# Patient Record
Sex: Female | Born: 1955
Health system: Southern US, Community
[De-identification: ages and names within clinical notes are randomized; demographics above are authoritative.]

## PROBLEM LIST (undated history)

## (undated) DIAGNOSIS — N6099 Unspecified benign mammary dysplasia of unspecified breast: Secondary | ICD-10-CM

## (undated) DIAGNOSIS — K219 Gastro-esophageal reflux disease without esophagitis: Secondary | ICD-10-CM

## (undated) DIAGNOSIS — I10 Essential (primary) hypertension: Secondary | ICD-10-CM

## (undated) DIAGNOSIS — K649 Unspecified hemorrhoids: Secondary | ICD-10-CM

## (undated) DIAGNOSIS — Z86718 Personal history of other venous thrombosis and embolism: Secondary | ICD-10-CM

## (undated) HISTORY — DX: Unspecified hemorrhoids: K64.9

## (undated) HISTORY — PX: BREAST BIOPSY: SHX20

## (undated) HISTORY — PX: BIOPSY THYROID: PRO38

## (undated) HISTORY — DX: Unspecified benign mammary dysplasia of unspecified breast: N60.99

## (undated) HISTORY — DX: Essential (primary) hypertension: I10

## (undated) HISTORY — DX: Personal history of other venous thrombosis and embolism: Z86.718

## (undated) HISTORY — DX: Gastro-esophageal reflux disease without esophagitis: K21.9

---

## 1995-06-10 HISTORY — PX: ABDOMINAL HYSTERECTOMY: SHX81

## 1999-02-05 ENCOUNTER — Encounter: Admission: RE | Admit: 1999-02-05 | Discharge: 1999-02-05 | Payer: Self-pay | Admitting: Sports Medicine

## 1999-02-27 ENCOUNTER — Encounter: Admission: RE | Admit: 1999-02-27 | Discharge: 1999-05-28 | Payer: Self-pay | Admitting: *Deleted

## 1999-03-07 ENCOUNTER — Encounter: Admission: RE | Admit: 1999-03-07 | Discharge: 1999-03-07 | Payer: Self-pay | Admitting: Family Medicine

## 1999-05-15 ENCOUNTER — Encounter: Admission: RE | Admit: 1999-05-15 | Discharge: 1999-05-15 | Payer: Self-pay | Admitting: Family Medicine

## 1999-05-22 ENCOUNTER — Encounter: Admission: RE | Admit: 1999-05-22 | Discharge: 1999-05-22 | Payer: Self-pay | Admitting: Family Medicine

## 1999-11-06 ENCOUNTER — Encounter: Admission: RE | Admit: 1999-11-06 | Discharge: 2000-02-04 | Payer: Self-pay | Admitting: *Deleted

## 2000-12-31 ENCOUNTER — Encounter: Admission: RE | Admit: 2000-12-31 | Discharge: 2000-12-31 | Payer: Self-pay | Admitting: Family Medicine

## 2001-01-11 ENCOUNTER — Encounter (INDEPENDENT_AMBULATORY_CARE_PROVIDER_SITE_OTHER): Payer: Self-pay | Admitting: *Deleted

## 2001-01-11 ENCOUNTER — Encounter: Admission: RE | Admit: 2001-01-11 | Discharge: 2001-01-11 | Payer: Self-pay | Admitting: Family Medicine

## 2001-02-12 ENCOUNTER — Encounter: Payer: Self-pay | Admitting: Family Medicine

## 2001-02-12 ENCOUNTER — Encounter: Admission: RE | Admit: 2001-02-12 | Discharge: 2001-02-12 | Payer: Self-pay | Admitting: Family Medicine

## 2003-01-08 ENCOUNTER — Encounter (INDEPENDENT_AMBULATORY_CARE_PROVIDER_SITE_OTHER): Payer: Self-pay | Admitting: *Deleted

## 2003-01-08 LAB — CONVERTED CEMR LAB

## 2003-02-06 ENCOUNTER — Encounter: Admission: RE | Admit: 2003-02-06 | Discharge: 2003-02-06 | Payer: Self-pay | Admitting: Family Medicine

## 2003-02-27 ENCOUNTER — Encounter: Admission: RE | Admit: 2003-02-27 | Discharge: 2003-02-27 | Payer: Self-pay | Admitting: Family Medicine

## 2003-04-14 ENCOUNTER — Encounter: Admission: RE | Admit: 2003-04-14 | Discharge: 2003-04-14 | Payer: Self-pay | Admitting: Family Medicine

## 2003-10-24 ENCOUNTER — Encounter: Admission: RE | Admit: 2003-10-24 | Discharge: 2003-10-24 | Payer: Self-pay | Admitting: Family Medicine

## 2003-11-27 ENCOUNTER — Encounter: Admission: RE | Admit: 2003-11-27 | Discharge: 2003-11-27 | Payer: Self-pay | Admitting: Family Medicine

## 2003-12-13 ENCOUNTER — Encounter: Admission: RE | Admit: 2003-12-13 | Discharge: 2003-12-13 | Payer: Self-pay | Admitting: Family Medicine

## 2004-09-11 ENCOUNTER — Ambulatory Visit: Payer: Self-pay | Admitting: Family Medicine

## 2005-01-14 ENCOUNTER — Encounter: Admission: RE | Admit: 2005-01-14 | Discharge: 2005-01-14 | Payer: Self-pay | Admitting: Sports Medicine

## 2005-01-16 ENCOUNTER — Ambulatory Visit: Payer: Self-pay | Admitting: Family Medicine

## 2005-12-26 ENCOUNTER — Ambulatory Visit: Payer: Self-pay | Admitting: Family Medicine

## 2006-03-03 ENCOUNTER — Encounter: Admission: RE | Admit: 2006-03-03 | Discharge: 2006-03-03 | Payer: Self-pay | Admitting: Family Medicine

## 2006-08-06 DIAGNOSIS — I10 Essential (primary) hypertension: Secondary | ICD-10-CM | POA: Insufficient documentation

## 2006-08-06 DIAGNOSIS — K219 Gastro-esophageal reflux disease without esophagitis: Secondary | ICD-10-CM | POA: Insufficient documentation

## 2006-08-06 DIAGNOSIS — M461 Sacroiliitis, not elsewhere classified: Secondary | ICD-10-CM | POA: Insufficient documentation

## 2006-08-06 DIAGNOSIS — N951 Menopausal and female climacteric states: Secondary | ICD-10-CM | POA: Insufficient documentation

## 2006-08-07 ENCOUNTER — Encounter (INDEPENDENT_AMBULATORY_CARE_PROVIDER_SITE_OTHER): Payer: Self-pay | Admitting: *Deleted

## 2007-03-05 ENCOUNTER — Telehealth: Payer: Self-pay | Admitting: *Deleted

## 2007-03-09 ENCOUNTER — Telehealth (INDEPENDENT_AMBULATORY_CARE_PROVIDER_SITE_OTHER): Payer: Self-pay | Admitting: Family Medicine

## 2007-03-16 ENCOUNTER — Ambulatory Visit: Payer: Self-pay | Admitting: Family Medicine

## 2007-03-16 ENCOUNTER — Encounter (INDEPENDENT_AMBULATORY_CARE_PROVIDER_SITE_OTHER): Payer: Self-pay | Admitting: Family Medicine

## 2007-03-16 DIAGNOSIS — E785 Hyperlipidemia, unspecified: Secondary | ICD-10-CM | POA: Insufficient documentation

## 2007-03-17 ENCOUNTER — Encounter (INDEPENDENT_AMBULATORY_CARE_PROVIDER_SITE_OTHER): Payer: Self-pay | Admitting: Family Medicine

## 2007-03-22 LAB — CONVERTED CEMR LAB
ALT: 17 units/L (ref 0–35)
AST: 19 units/L (ref 0–37)
Albumin: 4.3 g/dL (ref 3.5–5.2)
Alkaline Phosphatase: 38 units/L — ABNORMAL LOW (ref 39–117)
BUN: 13 mg/dL (ref 6–23)
CO2: 24 meq/L (ref 19–32)
Calcium: 10 mg/dL (ref 8.4–10.5)
Chloride: 103 meq/L (ref 96–112)
Cholesterol: 207 mg/dL — ABNORMAL HIGH (ref 0–200)
Creatinine, Ser: 0.78 mg/dL (ref 0.40–1.20)
Glucose, Bld: 87 mg/dL (ref 70–99)
HDL: 50 mg/dL (ref >39)
LDL Cholesterol: 142 mg/dL — ABNORMAL HIGH (ref 0–99)
Potassium: 4.2 meq/L (ref 3.5–5.3)
Sodium: 140 meq/L (ref 135–145)
TSH: 1.574 microintl units/mL (ref 0.350–5.50)
Total Bilirubin: 0.4 mg/dL (ref 0.3–1.2)
Total CHOL/HDL Ratio: 4.1
Total Protein: 6.9 g/dL (ref 6.0–8.3)
Triglycerides: 74 mg/dL (ref <150)
VLDL: 15 mg/dL (ref 0–40)

## 2007-04-14 ENCOUNTER — Ambulatory Visit: Payer: Self-pay | Admitting: Family Medicine

## 2007-04-14 ENCOUNTER — Encounter (INDEPENDENT_AMBULATORY_CARE_PROVIDER_SITE_OTHER): Payer: Self-pay | Admitting: Family Medicine

## 2007-04-14 DIAGNOSIS — J309 Allergic rhinitis, unspecified: Secondary | ICD-10-CM | POA: Insufficient documentation

## 2007-04-14 LAB — CONVERTED CEMR LAB
BUN: 13 mg/dL (ref 6–23)
CO2: 29 meq/L (ref 19–32)
Calcium: 10.3 mg/dL (ref 8.4–10.5)
Chloride: 102 meq/L (ref 96–112)
Creatinine, Ser: 0.76 mg/dL (ref 0.40–1.20)
Glucose, Bld: 94 mg/dL (ref 70–99)
Potassium: 3.9 meq/L (ref 3.5–5.3)
Sodium: 140 meq/L (ref 135–145)

## 2007-04-15 ENCOUNTER — Encounter (INDEPENDENT_AMBULATORY_CARE_PROVIDER_SITE_OTHER): Payer: Self-pay | Admitting: Family Medicine

## 2007-04-15 ENCOUNTER — Encounter: Admission: RE | Admit: 2007-04-15 | Discharge: 2007-04-15 | Payer: Self-pay | Admitting: Sports Medicine

## 2008-03-13 ENCOUNTER — Encounter (INDEPENDENT_AMBULATORY_CARE_PROVIDER_SITE_OTHER): Payer: Self-pay | Admitting: Family Medicine

## 2008-08-28 ENCOUNTER — Ambulatory Visit: Payer: Self-pay | Admitting: Family Medicine

## 2010-07-02 ENCOUNTER — Ambulatory Visit: Admission: RE | Admit: 2010-07-02 | Discharge: 2010-07-02 | Payer: Self-pay | Source: Home / Self Care

## 2010-07-02 ENCOUNTER — Encounter: Payer: Self-pay | Admitting: Family Medicine

## 2010-07-02 LAB — CONVERTED CEMR LAB
Albumin: 4.1 g/dL (ref 3.5–5.2)
Alkaline Phosphatase: 44 units/L (ref 39–117)
CO2: 29 meq/L (ref 19–32)
Calcium: 9.5 mg/dL (ref 8.4–10.5)
Chloride: 106 meq/L (ref 96–112)
Cholesterol: 210 mg/dL — ABNORMAL HIGH (ref 0–200)
Glucose, Bld: 95 mg/dL (ref 70–99)
LDL Cholesterol: 147 mg/dL — ABNORMAL HIGH (ref 0–99)
Potassium: 3.7 meq/L (ref 3.5–5.3)
Sodium: 141 meq/L (ref 135–145)
Total Protein: 6.6 g/dL (ref 6.0–8.3)
Triglycerides: 66 mg/dL (ref <150)

## 2010-07-09 ENCOUNTER — Encounter: Payer: Self-pay | Admitting: Family Medicine

## 2010-07-11 NOTE — Assessment & Plan Note (Signed)
Summary: follow-up/eo   TDAP GIVEN TODAY.Jimmy Footman, CMA  July 02, 2010 12:11 PM  Vital Signs:  Patient profile:   55 year old female Height:      65 inches Weight:      204.4 pounds BMI:     34.14 Temp:     98.5 degrees F oral Pulse rate:   84 / minute BP sitting:   174 / 91  (left arm) Cuff size:   large  Vitals Entered By: Jimmy Footman, CMA (July 02, 2010 11:02 AM) CC: follow-up Is Patient Diabetic? No Pain Assessment Patient in pain? no        Primary Care Provider:  Delbert Harness MD  CC:  follow-up.  History of Present Illness: 55 yo with hypertension here for follow-up.  Has not been seen since March 2010  hypertension: has not been checking BP or taking any medication in over a year.  "feels" blood pressure is high.  Denies cp, dyspnea, visual changes.    hyperlipidemia:  exercises by walking some daily.  No diet  Grief:  husband died two years ago, still feels sad as anniversary is coming up.  Feels she has good support, able to enjoy life and hobbies.    Habits & Providers  Alcohol-Tobacco-Diet     Tobacco Status: never  Current Medications (verified): 1)  Lisinopril 10 Mg Tabs (Lisinopril) .... Take One Tablet Daily  Allergies: 1)  ! Codeine  Past History:  Past Medical History: Last updated: 08/06/2006 h/o dvt with pe on ocp`s  Past Surgical History: Last updated: 08/06/2006 Hysterectomy - Partial fibroids -, skin tag removal - 02/27/2003  Family History: Last updated: 07/02/2010 Bro x 2-Prstate Ca, F-lung Ca (deceased at 65), M-DM, stroke, osteoporosis (deceased at 63), S-CAD, S-DM, S-throat Ca.  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  No breast ca- neice  Review of Systems General:  Denies chills, fever, and weight loss. Eyes:  Denies blurring. CV:  Complains of swelling of feet; denies chest pain or discomfort, fatigue, near fainting, and palpitations. Resp:  Denies cough and shortness of breath. GI:  Denies abdominal pain, bloody stools, constipation, nausea, and vomiting. GU:  Denies discharge and dysuria. MS:  Denies joint pain. Psych:  Denies anxiety and depression.  Physical Exam  General:  Well-developed,well-nourished,in no acute distress; alert,appropriate and cooperative throughout examination Eyes:  non icteric Mouth:  MMM, clear oropharynx.  upper dentures in place Neck:  supple, full ROM, and no masses.   Lungs:  Normal respiratory effort, chest expands symmetrically. Lungs are clear to auscultation, no crackles or wheezes. Heart:  Normal rate and regular rhythm. S1 and S2 normal without gallop, murmur, click, rub or other extra sounds. Abdomen:  Bowel sounds positive,abdomen soft and non-tender without masses, organomegaly or hernias noted. Extremities:  no LE edema Psych:  Oriented X3, memory intact for recent and remote, normally interactive, good eye contact, not anxious appearing, and not depressed appearing.     Impression & Recommendations:  Problem # 1:  HYPERTENSION, BENIGN SYSTEMIC (ICD-401.1) Will restart lisinopril.  patient states did not tolerate HCTZ due to having urinate too frequently.  Will follow-up in 2-3  weeks for recheck and will likely need increase and opssibly addition of another agent.  Would add amlodipine next.  The following medications were removed from the medication list:    Lopressor 50 Mg Tabs (Metoprolol tartrate) ..... One by mouth two times a day    Lisinopril-hydrochlorothiazide 10-12.5 Mg Tabs (Lisinopril-hydrochlorothiazide) ..... One tablet daily.  needs office visit. Her updated medication  list for this problem includes:    Lisinopril 10 Mg Tabs (Lisinopril) .Marland Kitchen... Take one tablet daily  Orders: Lipid-[facility] (81191-47829) Comp Met-[facility] (56213-08657) [facility]- Est  Level 4 (84696)  Problem # 2:  HYPERLIPIDEMIA NEC/NOS (ICD-272.4)  Will chekc flp tdoay  Orders: [facility]- Est  Level 4 (29528)  Problem # 3:  Preventive Health Care (ICD-V70.0)  will give tdap today and give info for mammogram.  She declines colonoscopy but will do stool cards.  Will return for annual gynecologcal exam  Orders: [facility]- Est  Level 4 (41324)  Complete Medication List: 1)  Lisinopril 10 Mg Tabs (Lisinopril) .... Take one tablet daily  Other Orders: TSH-[facility] (40102-72536) Tdap => 89yrs IM (64403) Admin 1st Vaccine (47425)  Patient Instructions: 1)  Check your blood pressure regularly and bring log to next visit 2)  Schedule annual gynecologcal visit in 2-3 weeks and we will recheck your labwork at that time and discuss results of tody's labwork (does not need to be fasting this time) 3)  Will restart lisinopril. 4)  Bring back stool cards Prescriptions: LISINOPRIL 10 MG TABS (LISINOPRIL) take one tablet daily  #30 x 1   Entered and Authorized by:   Delbert Harness MD   Signed by:   Delbert Harness MD on 07/02/2010   Method used:   Electronically to        [facility] [address] (retail)       [phone]       [place], [place]  [address]       Ph: [phone]       Fax: [phone]   RxID:   [id number]    Orders Added: 1)  Lipid-[facility] [80061-22930] 2)  Comp Met-[facility] [70623-76283] 3)  TSH-[facility] [15176-16073] 4)  Tdap => 76yrs IM [90715] 5)  Admin 1st Vaccine [90471] 6)  [facility]- Est  Level 4 [71062]   Tetanus Vaccine (to be given today)   Tetanus Vaccine (to be given today)   Prevention & Chronic Care Immunizations   Influenza vaccine: Not documented   Influenza vaccine due: 02/08/2012    Tetanus booster: 07/02/2010: Tdap    Pneumococcal vaccine: Not  documented  Colorectal Screening   Hemoccult: Not documented   Hemoccult due: Not Indicated    Colonoscopy: Not documented  Other Screening   Pap smear: Done.  (01/08/2003)   Pap smear due: Not Indicated    Mammogram: normal  (04/15/2007)   Mammogram due: 04/2008   Smoking status: never  (07/02/2010)  Lipids   Total Cholesterol: 207  (03/16/2007)   LDL: 142  (03/16/2007)   LDL Direct: Not documented   HDL: 50  (03/16/2007)   Triglycerides: 74  (03/16/2007)    SGOT (AST): 19  (03/16/2007)   SGPT (ALT): 17  (03/16/2007) CMP ordered    Alkaline phosphatase: 38  (03/16/2007)   Total bilirubin: 0.4  (03/16/2007)  Hypertension   Last Blood Pressure: 174 / 91  (07/02/2010)   Serum creatinine: 0.76  (04/14/2007)   Serum potassium 3.9  (04/14/2007) CMP ordered   Self-Management Support :  Personal Goals (by the next clinic visit) :      Personal blood pressure goal: 130/80  (07/02/2010)     Personal LDL goal: 130  (07/02/2010)    Patient will work on the following items until the next clinic visit to reach self-care goals:     Medications and monitoring: take my medicines every day, check my blood pressure  (07/02/2010)     Eating: use fresh or frozen vegetables, eat foods that are low in salt, eat baked foods instead of fried foods, eat fruit for snacks and desserts  (07/02/2010)     Activity: take a 30 minute walk every day  (07/02/2010)    Hypertension self-management support: Written self-care plan  (07/02/2010)   Hypertension self-care plan printed.    Lipid self-management support: Not documented    Nursing Instructions: Give tetanus booster today

## 2010-07-17 NOTE — Letter (Signed)
Summary: Lipid Letter  [facility]  [address]   [place], [place] [address]   Phone: [phone]  Fax: [phone]    07/09/2010  Demeisha Geraghty [address] [place], [place]  [address]  Dear Pattricia Boss:  We have carefully reviewed your last lipid profile from 07/02/2010 and the results are noted below with a summary of recommendations for lipid management.    Cholesterol:       210     Goal: <200   HDL "good" Cholesterol:   50     Goal: >40   LDL "bad" Cholesterol:   147     Goal: <160   Triglycerides:       66     Goal: <150    I do not think you need medications for your cholesterol, but you would benefit from Therapeutic lifestyle changes that will also make a big difference in your high blood pressure.  Please see below and we can discuss more at your next visit.  Your other labs which included thyroid level were all normal.    TLC Diet (Therapeutic Lifestyle Change): Saturated Fats & Transfatty acids should be kept < 7% of total calories ***Reduce Saturated Fats Polyunstaurated Fat can be up to 10% of total calories Monounsaturated Fat Fat can be up to 20% of total calories Total Fat should be no greater than 25-35% of total calories Carbohydrates should be 50-60% of total calories Protein should be approximately 15% of total calories Fiber should be at least 20-30 grams a day ***Increased fiber may help lower LDL Total Cholesterol should be < 200mg /day ***A higher intake of unsaturated fat may reduce Triglycerides and Increase HDL    Adjunctive Measures (may lower LIPIDS and reduce risk of Heart Attack) include: Aerobic Exercise (20-30 minutes 3-4 times a week) Limit Alcohol Consumption Weight Reduction Dietary Fiber 20-30 grams a day by mouth    Current Medications: 1)    Lisinopril 10 Mg Tabs (Lisinopril) .... Take one tablet daily  If you have any questions, please call. We appreciate being able to work with you.   Sincerely,    [facility] Delbert Harness MD  Appended Document: Lipid Letter letter mailed

## 2010-07-24 ENCOUNTER — Encounter: Payer: Self-pay | Admitting: *Deleted

## 2010-07-24 ENCOUNTER — Other Ambulatory Visit: Payer: Self-pay | Admitting: Family Medicine

## 2010-07-24 DIAGNOSIS — Z1231 Encounter for screening mammogram for malignant neoplasm of breast: Secondary | ICD-10-CM

## 2010-07-25 ENCOUNTER — Ambulatory Visit (INDEPENDENT_AMBULATORY_CARE_PROVIDER_SITE_OTHER): Payer: PRIVATE HEALTH INSURANCE | Admitting: Family Medicine

## 2010-07-25 ENCOUNTER — Other Ambulatory Visit (HOSPITAL_COMMUNITY)
Admission: RE | Admit: 2010-07-25 | Discharge: 2010-07-25 | Disposition: A | Discharge status: Home / Self Care | Payer: PRIVATE HEALTH INSURANCE | Source: Ambulatory Visit | Attending: Family Medicine | Admitting: Family Medicine

## 2010-07-25 ENCOUNTER — Encounter: Payer: Self-pay | Admitting: Family Medicine

## 2010-07-25 VITALS — BP 165/98 | HR 80 | Temp 98.0°F | Ht 66.0 in | Wt 201.5 lb

## 2010-07-25 DIAGNOSIS — Z124 Encounter for screening for malignant neoplasm of cervix: Secondary | ICD-10-CM

## 2010-07-25 DIAGNOSIS — N951 Menopausal and female climacteric states: Secondary | ICD-10-CM

## 2010-07-25 DIAGNOSIS — I10 Essential (primary) hypertension: Secondary | ICD-10-CM

## 2010-07-25 DIAGNOSIS — Z01419 Encounter for gynecological examination (general) (routine) without abnormal findings: Secondary | ICD-10-CM | POA: Insufficient documentation

## 2010-07-25 LAB — BASIC METABOLIC PANEL
BUN: 11 mg/dL (ref 6–23)
Chloride: 105 mEq/L (ref 96–112)
Potassium: 3.9 mEq/L (ref 3.5–5.3)

## 2010-07-25 MED ORDER — METOPROLOL TARTRATE 25 MG PO TABS: 25.0000 mg | ORAL_TABLET | Freq: Two times a day (BID) | ORAL | Status: AC

## 2010-07-25 NOTE — Patient Instructions (Addendum)
Keep blood pressure log New medicine for blood pressure Follow-up in 1 month

## 2010-07-25 NOTE — Progress Notes (Signed)
Annual Gynecological Exam  Last period: postmenopausal  Regular periods: n/a Heavy bleeding: n/a  Sexually active: no Birth control or hormonal therapy: none, contraindicated d/p PE/DVT Hx of STD: no Dyspareunia:no hot flashes: yes, occurs at night but she is awake as she works the night shifts.  Several time lasting 10 minutes. Vaginal discharge: no Dysuria: no  Last mammogram: up to date in the last year   Breast mass or concerns: no Last Pap: remote, hysterectomy History of abnormal pap: No  FH of breast, uterine, ovarian, colon cancer: No  CHRONIC HYPERTENSION  Disease Monitoring  Blood pressure range: 130-140/ 80-90  Chest pain: no   Dyspnea: no   Claudication: no   Medication compliance: yes  Medication Side Effects  Lightheadedness: no   Urinary frequency: no   Edema: no   Impotence: no   Preventitive Healthcare:  Exercise: yes, walks 30 minutes 5x per week   Diet Pattern: none  Salt Restriction: no   ROS:  As per HPI  Physical Exam  Constitutional: She is oriented to person, place, and time and well-developed, well-nourished, and in no distress.  HENT:  Head: Normocephalic and atraumatic.  Neck: Normal range of motion. Neck supple. No thyromegaly present.  Cardiovascular: Normal rate, regular rhythm and normal heart sounds.   No murmur heard. Pulmonary/Chest: Effort normal and breath sounds normal. No respiratory distress. She has no wheezes. She has no rales.  Abdominal: Soft. She exhibits no mass. There is no tenderness.  Genitourinary: Vagina normal. No vaginal discharge found.       Patient with history of hysterectomy.  Visually appears to be cervix, possible os palpated on bimanual exam.  Pap obtained.  Neurological: She is alert and oriented to person, place, and time.  Skin: Skin is warm.  Psychiatric: Mood and affect normal.

## 2010-07-26 NOTE — Assessment & Plan Note (Signed)
Obtained pap today although by history had hysterectomy, appears to have cervix.

## 2010-07-26 NOTE — Assessment & Plan Note (Signed)
Fairly good control at home but very elevated today.  Would benefit from additional control especially in light that she was previously on 3 agents.  Will add metoprolol.  Avoid HCTZ due to urinary frequency.  Will recheck lisinopril today.  If BP continues to be elevated, would increase lisinopril.

## 2010-07-26 NOTE — Assessment & Plan Note (Signed)
Does not interfere with quality of life.  Not a candidate for hormonal tx.  Advised increasing physical activity.  If continues to bother her, would discuss non-hormonal therapies further.

## 2010-07-31 ENCOUNTER — Encounter: Payer: Self-pay | Admitting: Family Medicine

## 2010-08-08 DIAGNOSIS — N6099 Unspecified benign mammary dysplasia of unspecified breast: Secondary | ICD-10-CM

## 2010-08-08 HISTORY — DX: Unspecified benign mammary dysplasia of unspecified breast: N60.99

## 2010-08-09 ENCOUNTER — Ambulatory Visit
Admission: RE | Admit: 2010-08-09 | Discharge: 2010-08-09 | Disposition: A | Discharge status: Home / Self Care | Payer: PRIVATE HEALTH INSURANCE | Source: Ambulatory Visit | Attending: *Deleted | Admitting: *Deleted

## 2010-08-09 DIAGNOSIS — Z1231 Encounter for screening mammogram for malignant neoplasm of breast: Secondary | ICD-10-CM

## 2010-08-13 ENCOUNTER — Other Ambulatory Visit: Payer: Self-pay | Admitting: *Deleted

## 2010-08-13 DIAGNOSIS — R928 Other abnormal and inconclusive findings on diagnostic imaging of breast: Secondary | ICD-10-CM

## 2010-08-20 ENCOUNTER — Ambulatory Visit
Admission: RE | Admit: 2010-08-20 | Discharge: 2010-08-20 | Disposition: A | Discharge status: Home / Self Care | Payer: PRIVATE HEALTH INSURANCE | Source: Ambulatory Visit | Attending: *Deleted | Admitting: *Deleted

## 2010-08-20 ENCOUNTER — Other Ambulatory Visit: Payer: Self-pay | Admitting: *Deleted

## 2010-08-20 ENCOUNTER — Other Ambulatory Visit: Payer: Self-pay | Admitting: Diagnostic Radiology

## 2010-08-20 DIAGNOSIS — R928 Other abnormal and inconclusive findings on diagnostic imaging of breast: Secondary | ICD-10-CM

## 2010-08-26 ENCOUNTER — Other Ambulatory Visit: Payer: Self-pay | Admitting: General Surgery

## 2010-08-26 DIAGNOSIS — N6099 Unspecified benign mammary dysplasia of unspecified breast: Secondary | ICD-10-CM

## 2010-08-29 ENCOUNTER — Ambulatory Visit (INDEPENDENT_AMBULATORY_CARE_PROVIDER_SITE_OTHER): Payer: PRIVATE HEALTH INSURANCE | Admitting: Family Medicine

## 2010-08-29 ENCOUNTER — Ambulatory Visit: Payer: PRIVATE HEALTH INSURANCE | Admitting: Family Medicine

## 2010-08-29 ENCOUNTER — Encounter: Payer: Self-pay | Admitting: Family Medicine

## 2010-08-29 VITALS — BP 130/90 | HR 82 | Temp 98.7°F | Ht 64.6 in | Wt 197.0 lb

## 2010-08-29 DIAGNOSIS — N951 Menopausal and female climacteric states: Secondary | ICD-10-CM

## 2010-08-29 DIAGNOSIS — N6310 Unspecified lump in the right breast, unspecified quadrant: Secondary | ICD-10-CM

## 2010-08-29 DIAGNOSIS — N63 Unspecified lump in unspecified breast: Secondary | ICD-10-CM

## 2010-08-29 DIAGNOSIS — I10 Essential (primary) hypertension: Secondary | ICD-10-CM

## 2010-08-29 LAB — BASIC METABOLIC PANEL
BUN: 15 mg/dL (ref 6–23)
CO2: 27 mEq/L (ref 19–32)
Glucose, Bld: 95 mg/dL (ref 70–99)
Potassium: 4.3 mEq/L (ref 3.5–5.3)
Sodium: 142 mEq/L (ref 135–145)

## 2010-08-29 MED ORDER — VENLAFAXINE HCL ER 37.5 MG PO CP24: ORAL_CAPSULE | ORAL | Status: DC

## 2010-08-29 NOTE — Patient Instructions (Signed)
I would avoid black cohosh as it may not be safe in breast cancer Start venlafaxine, one tablet a day for 1 week, then two tablets a day.   Follow-up in 2-3 months or sooner if needed.

## 2010-08-29 NOTE — Progress Notes (Signed)
  Subjective:    Patient ID: Angelica Park, female    DOB: 1955/09/13, 55 y.o.   MRN: [id number]  HPI Menopause:  Patient states menopausal symptoms we discussed at last visit are worse and would like to discuss treatment options.  Continues to have hot flashes during night and some during day.  She works night shift so it has more social stigma and impact on her life as she has these symptoms while she is at work.  Denies weight loss, changes in bowels, palpitations.  Enquires about starting use of black cohosh.  Breast mass:  Since last visit, mammogram has been abnormal, patient has a biopsy which per her report is "pre-cancer" and is planning on having a lumpectomy next week.  She is nervous but feels she knows what to expect.  HYPERTENSION  BP Readings from Last 3 Encounters:  08/29/10 130/90  07/25/10 165/98  07/02/10 174/91    Hypertension ROS: taking medications as instructed, no medication side effects noted, no TIA's, no chest pain on exertion, no dyspnea on exertion, no swelling of ankles and no intermittent claudication symptoms.   No results found for this basename: PROTEIN24HR         Review of Systems See hpi    Objective:   Physical Exam  Constitutional: She appears well-developed and well-nourished.  HENT:  Head: Normocephalic and atraumatic.  Neck: No thyromegaly present.  Cardiovascular: Normal rate and regular rhythm.   No murmur heard. Pulmonary/Chest: Effort normal. No respiratory distress. She has no wheezes. She has no rales.  Abdominal: Soft. She exhibits no distension. There is no tenderness. There is no rebound and no guarding.  Musculoskeletal: She exhibits no edema.          Assessment & Plan:

## 2010-08-30 NOTE — Assessment & Plan Note (Signed)
Not a candidate for hormonal tx.  Advised against black cohosh given her ongoing  workup for breast mass.  Will start effexor, avoiding sedating effects of gabapentin as she works the night shift when most of her symptoms occur.

## 2010-08-30 NOTE — Assessment & Plan Note (Signed)
Started lisinopril at last visit.  Improved control.  Will recheck cr.

## 2010-08-30 NOTE — Assessment & Plan Note (Signed)
Patient undergoing lumpectomy.  Patient states "precancer"  Will obtain records once final path returns or at next visit.

## 2010-09-05 ENCOUNTER — Ambulatory Visit
Admission: RE | Admit: 2010-09-05 | Discharge: 2010-09-05 | Disposition: A | Discharge status: Home / Self Care | Payer: PRIVATE HEALTH INSURANCE | Source: Ambulatory Visit | Attending: General Surgery | Admitting: General Surgery

## 2010-09-05 ENCOUNTER — Other Ambulatory Visit: Payer: Self-pay | Admitting: General Surgery

## 2010-09-05 ENCOUNTER — Encounter (HOSPITAL_BASED_OUTPATIENT_CLINIC_OR_DEPARTMENT_OTHER)
Admission: RE | Admit: 2010-09-05 | Discharge: 2010-09-05 | Disposition: A | Discharge status: Home / Self Care | Payer: PRIVATE HEALTH INSURANCE | Source: Ambulatory Visit | Attending: General Surgery | Admitting: General Surgery

## 2010-09-05 DIAGNOSIS — Z01811 Encounter for preprocedural respiratory examination: Secondary | ICD-10-CM

## 2010-09-05 DIAGNOSIS — Z01812 Encounter for preprocedural laboratory examination: Secondary | ICD-10-CM | POA: Insufficient documentation

## 2010-09-05 DIAGNOSIS — Z01810 Encounter for preprocedural cardiovascular examination: Secondary | ICD-10-CM | POA: Insufficient documentation

## 2010-09-05 LAB — BASIC METABOLIC PANEL
BUN: 8 mg/dL (ref 6–23)
CO2: 29 mEq/L (ref 19–32)
Chloride: 104 mEq/L (ref 96–112)
Creatinine, Ser: 0.77 mg/dL (ref 0.4–1.2)

## 2010-09-06 ENCOUNTER — Ambulatory Visit
Admission: RE | Admit: 2010-09-06 | Discharge: 2010-09-06 | Disposition: A | Discharge status: Home / Self Care | Payer: PRIVATE HEALTH INSURANCE | Source: Ambulatory Visit | Attending: General Surgery | Admitting: General Surgery

## 2010-09-06 ENCOUNTER — Other Ambulatory Visit: Payer: Self-pay | Admitting: General Surgery

## 2010-09-06 ENCOUNTER — Ambulatory Visit (HOSPITAL_BASED_OUTPATIENT_CLINIC_OR_DEPARTMENT_OTHER)
Admission: RE | Admit: 2010-09-06 | Discharge: 2010-09-06 | Disposition: A | Discharge status: Home / Self Care | Payer: PRIVATE HEALTH INSURANCE | Source: Ambulatory Visit | Attending: General Surgery | Admitting: General Surgery

## 2010-09-06 DIAGNOSIS — N6099 Unspecified benign mammary dysplasia of unspecified breast: Secondary | ICD-10-CM

## 2010-09-06 DIAGNOSIS — E669 Obesity, unspecified: Secondary | ICD-10-CM | POA: Insufficient documentation

## 2010-09-06 DIAGNOSIS — K219 Gastro-esophageal reflux disease without esophagitis: Secondary | ICD-10-CM | POA: Insufficient documentation

## 2010-09-06 DIAGNOSIS — N6089 Other benign mammary dysplasias of unspecified breast: Secondary | ICD-10-CM | POA: Insufficient documentation

## 2010-09-06 DIAGNOSIS — I10 Essential (primary) hypertension: Secondary | ICD-10-CM | POA: Insufficient documentation

## 2010-09-06 DIAGNOSIS — Z01812 Encounter for preprocedural laboratory examination: Secondary | ICD-10-CM | POA: Insufficient documentation

## 2010-09-08 HISTORY — PX: BREAST SURGERY: SHX581

## 2010-09-14 NOTE — Op Note (Signed)
  NAMECALYSTA, CRAIGO                ACCOUNT NO.:  [id number]  MEDICAL RECORD NO.:  [id number]          PATIENT TYPE:  LOCATION:                                 FACILITY:  PHYSICIAN:  Juanetta Gosling, MDDATE OF BIRTH:  06-06-1956  DATE OF PROCEDURE:  09/06/2010 DATE OF DISCHARGE:                              OPERATIVE REPORT   PREOPERATIVE DIAGNOSIS:  Right breast atypical ductal hyperplasia x2.  POSTOPERATIVE DIAGNOSIS:  Right breast atypical ductal hyperplasia x2.  PROCEDURE:  Right breast wire localization biopsy x2.  SURGEON:  Juanetta Gosling, MD  ASSISTANT:  None.  ANESTHESIA:  General.  SPECIMENS:  Right breast tissue x2 to Pathology.  ESTIMATED BLOOD LOSS:  Minimal.  COMPLICATIONS:  None.  DRAINS:  None.  DISPOSITION:  To recovery room in stable condition.  INDICATIONS:  This is a 55 year old female who underwent a screening mammogram and was found to have a couple different areas of abnormal calcifications.  She underwent stereotactic biopsies that showed ADH. She was then referred for surgical excision of these areas.  We discussed wire localization biopsies.  PROCEDURE:  After informed consent was obtained, the patient was taken to the operating room.  She first had 2 wires placed.  I had the mammograms available for my review.  She was administered 1 g of intravenous cefazolin.  Sequential compression devices were placed on her lower extremities prior to induction of anesthesia.  She was then placed under general anesthesia without complication.  Her right breast was prepped and draped in standard sterile surgical fashion.  The anterior most lesion near the nipple was approached first.  This was specimen 1 anterior.  I made a periareolar incision, used cautery to excise the wires all the way, as well as the surrounding tissue. Faxitron mammogram confirmed removal of the lesion.  Hemostasis was observed.  I packed this with a sponge.  I then went  to the posterior lesion.  This was labeled #2.  I made a radial incision overlying this lesion and removed this with electrocautery.  Faxitron mammogram confirmed removal of the clip and the lesion as well.  This was confirmed by the radiologist also.  I then closed both of these incisions with 3-0 Vicryl, 4-0 Monocryl.  Steri-Strips and sterile dressing were placed.  10 mL of 0.25% Marcaine were infiltrated at each wound.  Sterile dressings were placed over this.  She tolerated this well and was extubated in the operating room and transferred to the recovery room in a stable condition.     Juanetta Gosling, MD     MCW/MEDQ  D:  09/06/2010  T:  09/07/2010  Job:  [id number]  Electronically Signed by Emelia Loron MD on 09/14/2010 09:33:04 AM

## 2010-09-30 ENCOUNTER — Other Ambulatory Visit: Payer: Self-pay | Admitting: Family Medicine

## 2010-09-30 NOTE — Telephone Encounter (Signed)
refill request

## 2010-10-02 NOTE — Telephone Encounter (Signed)
Refill request

## 2010-10-14 ENCOUNTER — Encounter (INDEPENDENT_AMBULATORY_CARE_PROVIDER_SITE_OTHER): Payer: Self-pay | Admitting: General Surgery

## 2010-11-18 ENCOUNTER — Encounter: Payer: Self-pay | Admitting: Family Medicine

## 2010-12-30 ENCOUNTER — Encounter: Payer: PRIVATE HEALTH INSURANCE | Admitting: Oncology

## 2011-01-30 ENCOUNTER — Encounter (HOSPITAL_BASED_OUTPATIENT_CLINIC_OR_DEPARTMENT_OTHER): Payer: PRIVATE HEALTH INSURANCE | Admitting: Oncology

## 2011-01-30 DIAGNOSIS — N649 Disorder of breast, unspecified: Secondary | ICD-10-CM

## 2011-02-05 ENCOUNTER — Other Ambulatory Visit: Payer: Self-pay | Admitting: Oncology

## 2011-02-05 ENCOUNTER — Encounter (HOSPITAL_BASED_OUTPATIENT_CLINIC_OR_DEPARTMENT_OTHER): Payer: PRIVATE HEALTH INSURANCE | Admitting: Oncology

## 2011-02-05 DIAGNOSIS — N649 Disorder of breast, unspecified: Secondary | ICD-10-CM

## 2011-02-20 LAB — ESTRADIOL, ULTRA SENS

## 2011-02-26 ENCOUNTER — Other Ambulatory Visit: Payer: Self-pay | Admitting: Oncology

## 2011-02-26 ENCOUNTER — Encounter (HOSPITAL_BASED_OUTPATIENT_CLINIC_OR_DEPARTMENT_OTHER): Payer: PRIVATE HEALTH INSURANCE | Admitting: Oncology

## 2011-02-26 DIAGNOSIS — N649 Disorder of breast, unspecified: Secondary | ICD-10-CM

## 2011-02-26 LAB — CBC WITH DIFFERENTIAL/PLATELET
BASO%: 0.2 % (ref 0.0–2.0)
Basophils Absolute: 0 10*3/uL (ref 0.0–0.1)
Eosinophils Absolute: 0.1 10*3/uL (ref 0.0–0.5)
HCT: 38.6 % (ref 34.8–46.6)
HGB: 12.9 g/dL (ref 11.6–15.9)
MONO#: 0.4 10*3/uL (ref 0.1–0.9)
NEUT#: 2.4 10*3/uL (ref 1.5–6.5)
NEUT%: 44.2 % (ref 38.4–76.8)
WBC: 5.3 10*3/uL (ref 3.9–10.3)
lymph#: 2.6 10*3/uL (ref 0.9–3.3)

## 2011-02-26 LAB — COMPREHENSIVE METABOLIC PANEL
ALT: 14 U/L (ref 0–35)
BUN: 11 mg/dL (ref 6–23)
CO2: 28 mEq/L (ref 19–32)
Calcium: 10.1 mg/dL (ref 8.4–10.5)
Chloride: 103 mEq/L (ref 96–112)
Creatinine, Ser: 0.73 mg/dL (ref 0.50–1.10)
Glucose, Bld: 92 mg/dL (ref 70–99)

## 2011-03-03 ENCOUNTER — Encounter (HOSPITAL_BASED_OUTPATIENT_CLINIC_OR_DEPARTMENT_OTHER): Payer: PRIVATE HEALTH INSURANCE | Admitting: Oncology

## 2011-03-03 ENCOUNTER — Other Ambulatory Visit: Payer: Self-pay | Admitting: Oncology

## 2011-03-03 DIAGNOSIS — N649 Disorder of breast, unspecified: Secondary | ICD-10-CM

## 2011-03-03 LAB — FOLLICLE STIMULATING HORMONE: FSH: 46.2 m[IU]/mL

## 2011-03-17 ENCOUNTER — Encounter (HOSPITAL_BASED_OUTPATIENT_CLINIC_OR_DEPARTMENT_OTHER): Payer: PRIVATE HEALTH INSURANCE | Admitting: Oncology

## 2011-03-17 DIAGNOSIS — N649 Disorder of breast, unspecified: Secondary | ICD-10-CM

## 2011-03-19 ENCOUNTER — Ambulatory Visit (INDEPENDENT_AMBULATORY_CARE_PROVIDER_SITE_OTHER): Payer: PRIVATE HEALTH INSURANCE | Admitting: Family Medicine

## 2011-03-19 ENCOUNTER — Encounter: Payer: Self-pay | Admitting: Family Medicine

## 2011-03-19 VITALS — BP 136/78 | HR 91 | Temp 99.5°F | Wt 201.1 lb

## 2011-03-19 DIAGNOSIS — Z23 Encounter for immunization: Secondary | ICD-10-CM

## 2011-03-19 DIAGNOSIS — N63 Unspecified lump in unspecified breast: Secondary | ICD-10-CM

## 2011-03-19 DIAGNOSIS — N951 Menopausal and female climacteric states: Secondary | ICD-10-CM

## 2011-03-19 DIAGNOSIS — N6310 Unspecified lump in the right breast, unspecified quadrant: Secondary | ICD-10-CM

## 2011-03-19 DIAGNOSIS — I10 Essential (primary) hypertension: Secondary | ICD-10-CM

## 2011-03-19 DIAGNOSIS — M722 Plantar fascial fibromatosis: Secondary | ICD-10-CM

## 2011-03-19 DIAGNOSIS — E049 Nontoxic goiter, unspecified: Secondary | ICD-10-CM

## 2011-03-19 DIAGNOSIS — E010 Iodine-deficiency related diffuse (endemic) goiter: Secondary | ICD-10-CM

## 2011-03-19 LAB — T4, FREE: Free T4: 1.08 ng/dL (ref 0.80–1.80)

## 2011-03-19 NOTE — Patient Instructions (Signed)
You may try aleve and ice on your heel when pain is bad. Try stretches 3 times daily. Try heel raises and toe curls for foot strengthening.  Plantar Fasciitis Plantar fasciitis is a common condition that causes foot pain. It is soreness (inflammation) of the band of tough fibrous tissue on the bottom of the foot that runs from the heel bone (calcaneus) to the ball of the foot. The cause of this soreness may be from excessive standing, poor fitting shoes, running on hard surfaces, being overweight, having an abnormal walk, or overuse (this is common in runners) of the painful foot or feet. It is also common in aerobic exercise dancers and ballet dancers.  SYMPTOMS Most people with plantar fasciitis complain of:  Severe pain in the morning on the bottom of their foot especially when taking the first steps out of bed. This pain recedes after a few minutes of walking.   Severe pain is experienced also during walking following a long period of inactivity.   Pain is worse when walking barefoot or up stairs  DIAGNOSIS  Your caregiver will diagnose this condition by examining and feeling your foot.   Special tests such as x-rays of your foot, are usually not needed.  PREVENTION  Consult a sports medicine professional before beginning a new exercise program.   Walking programs offer a good workout. With walking there is a lower chance of overuse injuries common to runners. There is less impact and less jarring of the joints.   Begin all new exercise programs slowly. If problems or pain develop, decrease the amount of time or distance until you are at a comfortable level.   Wear good shoes and replace them regularly.   Stretch your foot and the heel cords at the back of the ankle (Achilles tendon) both before and after exercise.   Run or exercise on even surfaces that are not hard. For example, asphalt is better than pavement.   Do not run barefoot on hard surfaces.   If using a treadmill,  vary the incline.   Do not continue to workout if you have foot or joint problems. Seek professional help if they do not improve.  HOME CARE INSTRUCTIONS  Avoid activities that cause you pain until you recover.   Use ice or cold packs on the problem or painful areas after working out.   Only take over-the-counter or prescription medicines for pain, discomfort, or fever as directed by your caregiver.   Soft shoe inserts or athletic shoes with air or gel sole cushions may be helpful.   If problems continue or become more severe, consult a sports medicine caregiver or your own health care provider. Cortisone is a potent anti-inflammatory medication that may be injected into the painful area. You can discuss this treatment with your caregiver.  MAKE SURE YOU:  Understand these instructions.   Will watch your condition.   Will get help right away if you are not doing well or get worse.  Document Released: 02/18/2001 Document Re-Released: 08/20/2009 [facility] Patient Information 2011 [place], [place].

## 2011-03-20 DIAGNOSIS — M722 Plantar fascial fibromatosis: Secondary | ICD-10-CM | POA: Insufficient documentation

## 2011-03-20 NOTE — Assessment & Plan Note (Signed)
Right sided, failed initial attempts at conservative management with NSAID and arch support. Patient is significantly limited in her exercise by pain, requests steroid injection. Also reinforced and demonstrated appropriate stretching including toe curls, heel raises/stretches to perform daily. Ice and motrin prn for pain.   Skin sterilized with alcohol, Injected 2:1 lidocaine/kenalog via 25 gauge needle into antero-inferior aspect of plantar fascia. Patient tolerated procedure well with no immediate complications.

## 2011-03-20 NOTE — Assessment & Plan Note (Signed)
No overt signs of dysregulation. Appearance of diffuse goiter. Will start with TSH and free hormone levels, and possibly progress to ultrasound imaging and further workup if indicated.

## 2011-03-20 NOTE — Assessment & Plan Note (Signed)
To be followed up by surgeon at [facility] who is initiating evista according to patient.

## 2011-03-20 NOTE — Assessment & Plan Note (Signed)
At goal, will continue current medications. Labs repeated in one year.

## 2011-03-20 NOTE — Progress Notes (Signed)
  Subjective:    Patient ID: Angelica Park, female    DOB: Oct 26, 1955, 55 y.o.   MRN: [id number]  HPI  1. Thyroid check. Patient was at dentist recently who noticed some thyromegaly and questioned if she'd had any testing done. She presents today for this. Dose note a sensation of fullness on her throat. No difficulty breathing, swallowing or hoarseness. She endorses some stable fatigue, weight loss of 5 lbs in past month. Denies constipation, diarrhea, skin/hair changes, palpitations. Patient's mother has a goiter and sister has unspecified thyroid disorder.   2. Heel pain. Present for 3 months and worsening. Was told to try dr. Jari Sportsman arch support inserts and these have not helped, nor does intermittent motrin. Tries to avoid exacerbating foot positions. Has not tried daily stretches. Walking is only means of exercise. Denies numbness, tingling, trauma.  3. Breast lumps. [facility] Dr[facility] Pascal Lux. Is going to start evista for prevention and osteoporosis. Mass is not cancer, but will continue to be followed closely.  Review of Systems See HPI otherwise negative.     Objective:   Physical Exam  Vitals reviewed. Constitutional: She is oriented to person, place, and time. She appears well-developed and well-nourished. No distress.  HENT:  Head: Normocephalic and atraumatic.  Mouth/Throat: Oropharynx is clear and moist.  Eyes: EOM are normal. Pupils are equal, round, and reactive to light.  Neck: Neck supple. Thyromegaly present.       Diffuse thyromegaly. Mildly TTP. No nodules palpated.  Cardiovascular: Normal rate, regular rhythm, normal heart sounds and intact distal pulses.   No murmur heard. Pulmonary/Chest: Effort normal and breath sounds normal. No respiratory distress. She has no wheezes. She has no rales.  Musculoskeletal: She exhibits no edema and no tenderness.       Right heel TTP at anterior/inferior aspect of calcaneus in plantar fascia. No edema, nodules or skin  lesions. Mild collapse of transverse arches.   Lymphadenopathy:    She has no cervical adenopathy.  Neurological: She is alert and oriented to person, place, and time. No cranial nerve deficit. She exhibits normal muscle tone. Coordination normal.  Skin: Skin is warm. She is not diaphoretic.          Assessment & Plan:

## 2011-03-20 NOTE — Assessment & Plan Note (Signed)
Patient never initiated effexor, and she is to begin evista for breast mass prophylaxis. Will f/u menopausal symptoms after this therapy is initiated.

## 2011-03-21 ENCOUNTER — Telehealth: Payer: Self-pay | Admitting: Family Medicine

## 2011-03-21 NOTE — Telephone Encounter (Signed)
Thyroid U/S sched for Tues Oct 16 at 11:00 at [facility]. Pt notified.

## 2011-03-25 ENCOUNTER — Other Ambulatory Visit: Payer: PRIVATE HEALTH INSURANCE

## 2011-03-27 ENCOUNTER — Ambulatory Visit
Admission: RE | Admit: 2011-03-27 | Discharge: 2011-03-27 | Disposition: A | Discharge status: Home / Self Care | Payer: PRIVATE HEALTH INSURANCE | Source: Ambulatory Visit | Attending: Family Medicine | Admitting: Family Medicine

## 2011-03-27 DIAGNOSIS — E010 Iodine-deficiency related diffuse (endemic) goiter: Secondary | ICD-10-CM

## 2011-04-02 ENCOUNTER — Telehealth: Payer: Self-pay | Admitting: Family Medicine

## 2011-04-02 ENCOUNTER — Other Ambulatory Visit: Payer: Self-pay | Admitting: Family Medicine

## 2011-04-02 DIAGNOSIS — E010 Iodine-deficiency related diffuse (endemic) goiter: Secondary | ICD-10-CM

## 2011-04-02 NOTE — Telephone Encounter (Signed)
Called pt to discuss thyroid ultrasound results, but left a message. She needs a biopsy to exclude cancer. I made a referral order for this.

## 2011-04-03 ENCOUNTER — Telehealth: Payer: Self-pay | Admitting: *Deleted

## 2011-04-03 NOTE — Telephone Encounter (Signed)
Appointment set up at [facility] for 11/7 arrive at 10:15am for 10:45am appointment with Dr. Andrey Campanile. [address]. Phone number [phone] patient to call at least 48 hours in advance if needing to cancel this appointment.   Faxed OV notes and referral to [facility] [phone]

## 2011-04-04 NOTE — Telephone Encounter (Signed)
Spoke with patient and she states that she does not have insurance and will have to cancel. Will look into other options

## 2011-04-16 ENCOUNTER — Ambulatory Visit (INDEPENDENT_AMBULATORY_CARE_PROVIDER_SITE_OTHER): Payer: Self-pay | Admitting: General Surgery

## 2011-04-16 ENCOUNTER — Encounter (INDEPENDENT_AMBULATORY_CARE_PROVIDER_SITE_OTHER): Payer: Self-pay | Admitting: General Surgery

## 2011-04-16 VITALS — BP 166/98 | HR 60 | Temp 96.9°F | Resp 18 | Ht 65.5 in | Wt 202.1 lb

## 2011-04-16 DIAGNOSIS — E049 Nontoxic goiter, unspecified: Secondary | ICD-10-CM

## 2011-04-16 DIAGNOSIS — E041 Nontoxic single thyroid nodule: Secondary | ICD-10-CM

## 2011-04-16 DIAGNOSIS — E010 Iodine-deficiency related diffuse (endemic) goiter: Secondary | ICD-10-CM

## 2011-04-16 NOTE — Patient Instructions (Signed)
Thyroid Diseases Your thyroid is a butterfly-shaped gland in your neck. It is located just above your collarbone. It is one of your endocrine glands, which make hormones. The thyroid helps set your metabolism. Metabolism is how your body gets energy from the foods you eat.  Millions of people have thyroid diseases. Women experience thyroid problems more often than men. In fact, overactive thyroid problems (hyperthyroidism) occur in 1% of all women. If you have a thyroid disease, your body may use energy more slowly or quickly than it should.  Thyroid problems also include an immune disease where your body reacts against your thyroid gland (called thyroiditis). A different problem involves lumps and bumps (called nodules) that develop in the gland. The nodules are usually, but not always, noncancerous. THE MOST COMMON THYROID PROBLEMS AND CAUSES ARE DISCUSSED BELOW There are many causes for thyroid problems. Treatment depends upon the exact diagnosis and includes trying to reset your body's metabolism to a normal rate. Hyperthyroidism Too much thyroid hormone from an overactive thyroid gland is called hyperthyroidism. In hyperthyroidism, the body's metabolism speeds up. One of the most frequent forms of hyperthyroidism is known as Graves' disease. Graves' disease tends to run in families. Although Graves' is thought to be caused by a problem with the immune system, the exact nature of the genetic problem is unknown. Hypothyroidism Too little thyroid hormone from an underactive thyroid gland is called hypothyroidism. In hypothyroidism, the body's metabolism is slowed. Several things can cause this condition. Most causes affect the thyroid gland directly and hurt its ability to make enough hormone.  Rarely, there may be a pituitary gland tumor (located near the base of the brain). The tumor can block the pituitary from producing thyroid-stimulating hormone (TSH). Your body makes TSH to stimulate the thyroid  to work properly. If the pituitary does not make enough TSH, the thyroid fails to make enough hormones needed for good health. Whether the problem is caused by thyroid conditions or by the pituitary gland, the result is that the thyroid is not making enough hormones. Hypothyroidism causes many physical and mental processes to become sluggish. The body consumes less oxygen and produces less body heat. Thyroid Nodules A thyroid nodule is a small swelling or lump in the thyroid gland. They are common. These nodules represent either a growth of thyroid tissue or a fluid-filled cyst. Both form a lump in the thyroid gland. Almost half of all people will have tiny thyroid nodules at some point in their lives. Typically, these are not noticeable until they become large and affect normal thyroid size. Larger nodules that are greater than a half inch across (about 1 centimeter) occur in about 5 percent of people. Although most nodules are not cancerous, people who have them should seek medical care to rule out cancer. Also, some thyroid nodules may produce too much thyroid hormone or become too large. Large nodules or a large gland can interfere with breathing or swallowing or may cause neck discomfort. Other problems Other thyroid problems include cancer and thyroiditis. Thyroiditis is a malfunction of the body's immune system. Normally, the immune system works to defend the body against infection and other problems. When the immune system is not working properly, it may mistakenly attack normal cells, tissues, and organs. Examples of autoimmune diseases are Hashimoto's thyroiditis (which causes low thyroid function) and Graves' disease (which causes excess thyroid function). SYMPTOMS  Symptoms vary greatly depending upon the exact type of problem with the thyroid. Hyperthyroidism-is when your thyroid is too   active and makes more thyroid hormone than your body needs. The most common cause is Graves' Disease. Too  much thyroid hormone can cause some or all of the following symptoms:  Anxiety.   Irritability.   Difficulty sleeping.   Fatigue.   A rapid or irregular heartbeat.   A fine tremor of your hands or fingers.   An increase in perspiration.   Sensitivity to heat.   Weight loss, despite normal food intake.   Brittle hair.   Enlargement of your thyroid gland (goiter).   Light menstrual periods.   Frequent bowel movements.  Graves' disease can specifically cause eye and skin problems. The skin problems involve reddening and swelling of the skin, often on your shins and on the top of your feet. Eye problems can include the following:  Excess tearing and sensation of grit or sand in either or both eyes.   Reddened or inflamed eyes.   Widening of the space between your eyelids.   Swelling of the lids and tissues around the eyes.   Light sensitivity.   Ulcers on the cornea.   Double vision.   Limited eye movements.   Blurred or reduced vision.  Hypothyroidism- is when your thyroid gland is not active enough. This is more common than hyperthyroidism. Symptoms can vary a lot depending of the severity of the hormone deficiency. Symptoms may develop over a long period of time and can include several of the following:  Fatigue.   Sluggishness.   Increased sensitivity to cold.   Constipation.   Pale, dry skin.   A puffy face.   Hoarse voice.   High blood cholesterol level.   Unexplained weight gain.   Muscle aches, tenderness and stiffness.   Pain, stiffness or swelling in your joints.   Muscle weakness.   Heavier than normal menstrual periods.   Brittle fingernails and hair.   Depression.  Thyroid Nodules - most do not cause signs or symptoms. Occasionally, some may become so large that you can feel or even see the swelling at the base of your neck. You may realize a lump or swelling is there when you are shaving or putting on makeup. Men might become  aware of a nodule when shirt collars suddenly feel too tight. Some nodules produce too much thyroid hormone. This can produce the same symptoms as hyperthyroidism (see above). Thyroid nodules are seldom cancerous. However, a nodule is more likely to be malignant (cancerous) if it:  Grows quickly or feels hard.   Causes you to become hoarse or to have trouble swallowing or breathing.   Causes enlarged lymph nodes under your jaw or in your neck.  DIAGNOSIS  Because there are so many possible thyroid conditions, your caregiver may ask for a number of tests. They will do this in order to narrow down the exact diagnosis. These tests can include:  Blood and antibody tests.   Special thyroid scans using small, safe amounts of radioactive iodine.   Ultrasound of the thyroid gland (particularly if there is a nodule or lump).   Biopsy. This is usually done with a special needle. A needle biopsy is a procedure to obtain a sample of cells from the thyroid. The tissue will be tested in a lab and examined under a microscope.  TREATMENT  Treatment depends on the exact diagnosis. Hyperthyroidism  Beta-blockers help relieve many of the symptoms.   Anti-thyroid medications prevent the thyroid from making excess hormones.   Radioactive iodine treatment can destroy overactive thyroid   cells. The iodine can permanently decrease the amount of hormone produced.   Surgery to remove the thyroid gland.   Treatments for eye problems that come from Graves' disease also include medications and special eye surgery, if felt to be appropriate.  Hypothyroidism Thyroid replacement with levothyroxine is the mainstay of treatment. Treatment with thyroid replacement is usually lifelong and will require monitoring and adjustment from time to time. Thyroid Nodules  Watchful waiting. If a small nodule causes no symptoms or signs of cancer on biopsy, then no treatment may be chosen at first. Re-exam and re-checking blood  tests would be the recommended follow-up.   Anti-thyroid medications or radioactive iodine treatment may be recommended if the nodules produce too much thyroid hormone (see Treatment for Hyperthyroidism above).   Alcohol ablation. Injections of small amounts of ethyl alcohol (ethanol) can cause a non-cancerous nodule to shrink in size.   Surgery (see Treatment for Hyperthyroidism above).  HOME CARE INSTRUCTIONS   Take medications as instructed.   Follow through on recommended testing.  SEEK MEDICAL CARE IF:   You feel that you are developing symptoms of Hyperthyroidism or Hypothyroidism as described above.   You develop a new lump/nodule in the neck/thyroid area that you had not noticed before.   You feel that you are having side effects from medicines prescribed.   You develop trouble breathing or swallowing.  SEEK IMMEDIATE MEDICAL CARE IF:   You develop a fever of 102 F (38.9 C) or higher.   You develop severe sweating.   You develop palpitations and/or rapid heart beat.   You develop shortness of breath.   You develop nausea and vomiting.   You develop extreme shakiness.   You develop agitation.   You develop lightheadedness or have a fainting episode.  Document Released: 03/23/2007 Document Revised: 02/05/2011 Document Reviewed: 03/23/2007 [employer] Patient Information 2012 [place], [place].

## 2011-04-16 NOTE — Progress Notes (Signed)
Chief Complaint  Patient presents with  . New Evaluation    eval of thyroid nodule     HPI Angelica Park is a 55 y.o. female.  HPI 55 year old African American female referred by her primary care physician for evaluation of thyromegaly. The patient was at her dentist office when her dentist commented that her thyroid appeared large. She followed with her primary care physician who agreed that she had thyromegaly.  She has undergone a thyroid ultrasound as well as thyroid hormone level evaluation.  She denies any personal history of neck or chest radiation. She denies any trouble swallowing liquids or solids. She denies any voice changes. She denies any stridor. She denies any wheezing. She states that her throat sometimes fills full. She denies any palpitations, weight change, and diarrhea, constipation, mood changes, or hair changes. She is not aware of any thyroid cancer in the family. She is not aware of any pancreatic cancer in the family. She states that her sister died at 40 years ago for "throat" cancer. She has some mild reflux. Past Medical History  Diagnosis Date  . History of DVT (deep vein thrombosis)     Had PE on OCP's  . Hypertension   . Hemorrhoid   . GERD (gastroesophageal reflux disease)   . Atypical ductal hyperplasia of breast 08/2010    right    Past Surgical History  Procedure Date  . Abdominal hysterectomy     fibroids  . Breast surgery 4/12    removal of lumps in breast     Family History  Problem Relation Age of Onset  . Diabetes Mother   . Stroke Mother   . Cancer Father     lung  . Diabetes Sister   . Cancer Sister     throat ca  . Cancer Brother     prostate  . Cancer Other     niece; breast ca 32yrs    Social History History  Substance Use Topics  . Smoking status: Never Smoker   . Smokeless tobacco: Never Used  . Alcohol Use: Yes     EVERY NOW AND THEN    Allergies  Allergen Reactions  . Codeine     Current Outpatient  Prescriptions  Medication Sig Dispense Refill  . lisinopril (PRINIVIL,ZESTRIL) 10 MG tablet TAKE ONE TABLET BY MOUTH EVERY DAY  30 tablet  11  . metoprolol (LOPRESSOR) 25 MG tablet Take 1 tablet (25 mg total) by mouth 2 (two) times daily.  60 tablet  5    Review of Systems Review of Systems  Constitutional: Negative for fever, chills and unexpected weight change.  HENT: Negative for hearing loss, congestion, sore throat, trouble swallowing, neck pain, neck stiffness and voice change.   Eyes: Negative for visual disturbance.  Respiratory: Negative for cough, shortness of breath, wheezing and stridor.   Cardiovascular: Negative for chest pain, palpitations and leg swelling.       Sleeps on 1 pillow  Gastrointestinal: Negative for nausea, vomiting, abdominal pain, diarrhea, constipation, blood in stool, abdominal distention and anal bleeding.       Some hemorrhoids; some reflux  Genitourinary: Negative for hematuria, vaginal bleeding and difficulty urinating.  Musculoskeletal: Negative for arthralgias.  Skin: Negative for rash and wound.  Neurological: Negative for seizures, syncope and headaches.  Hematological: Negative for adenopathy. Does not bruise/bleed easily.  Psychiatric/Behavioral: Negative for confusion.    Blood pressure 166/98, pulse 60, temperature 96.9 F (36.1 C), temperature source Temporal, resp. rate 18, height  5' 5.5" (1.664 m), weight 202 lb 2 oz (91.683 kg).  Physical Exam Physical Exam  Vitals reviewed. Constitutional: She is oriented to person, place, and time. She appears well-developed and well-nourished. No distress.  HENT:  Head: Normocephalic and atraumatic.  Eyes: Conjunctivae are normal. No scleral icterus.  Neck: Normal range of motion. Neck supple. No JVD present. No tracheal deviation present. Thyromegaly present.       B/l thyromegaly, Left>>R  Cardiovascular: Normal rate, regular rhythm and normal heart sounds.   Pulmonary/Chest: Effort normal and  breath sounds normal. No stridor. No respiratory distress. She has no wheezes.  Abdominal: Soft. Bowel sounds are normal. She exhibits no distension. There is no tenderness.       Well healed low transverse incision  Musculoskeletal: Normal range of motion. She exhibits no edema and no tenderness.  Lymphadenopathy:    She has no cervical adenopathy.  Neurological: She is alert and oriented to person, place, and time. She exhibits normal muscle tone.  Skin: Skin is warm and dry. No rash noted. No erythema.  Psychiatric: She has a normal mood and affect. Her behavior is normal. Thought content normal.    Data Reviewed Referring provider's note   NML thyroid function labs  THYROID ULTRASOUND  Technique: Ultrasound examination of the thyroid gland and adjacent  soft tissues was performed.  Comparison: None.  Findings:   Right thyroid lobe: 8.0 x 2.4 x 3.9 cm. Enlarged right lobe.  Heterogeneous echotexture.   Left thyroid lobe: 9.0 x 3.1 x 3.9 cm. Enlarged left lobe with  heterogeneous echotexture   Isthmus: 2.4 cm   Focal nodules: Nodule involving the left isthmus measures 4.4 x  2.4 x 3.5 cm and is solid and heterogeneous with areas of increased  echogenicity.  Right mid pole nodule is solid measuring 2.6 x 1.2 x 2.0 cm.  Right lower lobe solid nodule measures 2.7 x 1.6 x 2.6 cm.  Left lower pole solid nodule with calcifications measures 2.9 x 2.9  x 3.5 cm.   Small upper lobe nodules bilaterally. No cysts are present.   Lymphadenopathy: None visualized.  IMPRESSION:  The thyroid is enlarged bilaterally. Multiple thyroid nodules are  present. Tissue sampling is suggested of the larger nodules to  exclude malignancy.    Assessment    Thyromegaly B/l Thyroid nodules    Plan    She has a thyroid goiter. She appears to be euthyroid. Her nodules are of sufficient size where they warrant further investigation with an ultrasound-guided FNA biopsy. The nodule that I was  concerned about is the left lower pole nodule since it has calcifications within it. This is the nodule that we'll ask radiology to target on biopsy.  The patient was given educational material. We discussed the thyroid gland and its function. We discussed thyroid goiter and thyroid nodules. My recommendation to the patient was first to proceed with an FNA biopsy of the left lower pole thyroid nodule.  I will bring her back in 2 weeks to discuss the biopsy results.  Mary Sella. Andrey Campanile, MD, FACS General, Bariatric, & Minimally Invasive Surgery [facility], [place]        [facility] M 04/16/2011, 12:02 PM

## 2011-04-17 NOTE — Progress Notes (Signed)
Addended byLiliana Cline on: 04/17/2011 08:56 AM   Modules accepted: Orders

## 2011-04-21 ENCOUNTER — Ambulatory Visit (HOSPITAL_COMMUNITY)
Admission: RE | Admit: 2011-04-21 | Discharge: 2011-04-21 | Disposition: A | Discharge status: Home / Self Care | Payer: Self-pay | Source: Ambulatory Visit | Attending: General Surgery | Admitting: General Surgery

## 2011-04-21 DIAGNOSIS — E049 Nontoxic goiter, unspecified: Secondary | ICD-10-CM | POA: Insufficient documentation

## 2011-04-21 DIAGNOSIS — E010 Iodine-deficiency related diffuse (endemic) goiter: Secondary | ICD-10-CM

## 2011-04-21 DIAGNOSIS — E041 Nontoxic single thyroid nodule: Secondary | ICD-10-CM

## 2011-04-21 NOTE — Procedures (Signed)
Left thyroid nodule Biopsy #3 25 g samples Without complication  Isthmus nodule Biopsy #3 25g samples taken Without complication  Pt tolerated well. Sent to lab

## 2011-05-07 ENCOUNTER — Ambulatory Visit (INDEPENDENT_AMBULATORY_CARE_PROVIDER_SITE_OTHER): Payer: Self-pay | Admitting: General Surgery

## 2011-05-07 ENCOUNTER — Encounter (INDEPENDENT_AMBULATORY_CARE_PROVIDER_SITE_OTHER): Payer: Self-pay | Admitting: General Surgery

## 2011-05-07 VITALS — BP 150/90 | HR 72 | Temp 97.8°F | Resp 16 | Ht 66.5 in | Wt 204.0 lb

## 2011-05-07 DIAGNOSIS — E049 Nontoxic goiter, unspecified: Secondary | ICD-10-CM

## 2011-05-07 NOTE — Patient Instructions (Signed)
We will repeat your neck ultrasound in 2.5 months. Call if you develop symptoms (troubling swallowing/breathing, etc)

## 2011-05-07 NOTE — Progress Notes (Signed)
Chief complaint: here to discuss biopsy results  History of present illness: 55 year old African American female who was initially saw on November 7 for thyromegaly as well as the bilateral thyroid nodules comes in today to discuss her biopsy results. She underwent an ultrasound-guided FNA biopsy of her left large thyroid nodule as well as her thyroid isthmus. She denies any new changes. She has no new symptoms.  Past medical, surgical, family, and social histories reviewed and unchanged.  Medications and allergies are reviewed and unchanged.  Review of systems-a 10 point review of systems was performed all systems are unchanged from her visit from 2 weeks ago.  Physical exam: BP 150/90  Pulse 72  Temp(Src) 97.8 F (36.6 C) (Temporal)  Resp 16  Ht 5' 6.5" (1.689 m)  Wt 204 lb (92.534 kg)  BMI 32.43 kg/m2  Gen.-well-developed, well-nourished overweight African American female in no apparent distress HEENT-atraumatic, normocephalic. Neck supple. Bilateral thyromegaly, left greater than right. No lymphadenopathy Pulmonary-lungs are clear Neuro-nonfocal Psychiatric-alert and oriented, appropriate  Data reviewed: #1-fine needle aspiration of thyroid isthmus showed findings consistent with a cyst; however the specimen showed less than 6 groups of 10 follicular cells therefore a non-diagnostic specimen.  #2-fine needle aspiration of left thyroid nodule showed findings consistent with a cyst; however nondiagnostic specimen  Assessment and plan: 55 year old African American female with thyromegaly and bilateral thyroid nodules.  We discussed the results of her biopsy results. I explained to her that the results were inconclusive and nondiagnostic.  Because she has large bilateral thyroid nodules as well as an underlying goiter (euthyroid), I recommended total thyroidectomy. We discussed the risk and benefits of surgery including but not limited to bleeding, infection, hematoma formation,  injury to the parathyroid glands requiring supplemental calcium administration, injury to the recurrent laryngeal nerves causing hoarseness, loss of projection of her voice, scarring, blood clot formation, general anesthesia risk, and need for lifelong thyroid replacement hormone.  We also discussed repeat biopsy since this biopsy was nondiagnostic. I advised her that it certainly was possible that a repeat biopsy would also be nondiagnostic.  We also discussed short interval followup in 3 months with a repeat ultrasound. We also discussed referral to an endocrinologist.  My suspicion for an underlying cancer is low; however, I explained to the patient that I cannot say that she does not have cancer with a 100% certainty.  After discussing all the above options for about 15 minutes, the patient has elected to proceed with short interval followup with a repeat ultrasound in 2-1/2 months.   Mary Sella. Andrey Campanile, MD, FACS General, Bariatric, & Minimally Invasive Surgery [facility], [place]

## 2011-07-03 ENCOUNTER — Encounter (INDEPENDENT_AMBULATORY_CARE_PROVIDER_SITE_OTHER): Payer: Self-pay | Admitting: General Surgery

## 2011-07-07 ENCOUNTER — Ambulatory Visit
Admission: RE | Admit: 2011-07-07 | Discharge: 2011-07-07 | Disposition: A | Discharge status: Home / Self Care | Payer: No Typology Code available for payment source | Source: Ambulatory Visit | Attending: General Surgery | Admitting: General Surgery

## 2011-07-07 DIAGNOSIS — E049 Nontoxic goiter, unspecified: Secondary | ICD-10-CM

## 2011-07-12 ENCOUNTER — Telehealth: Payer: Self-pay | Admitting: Oncology

## 2011-07-12 NOTE — Telephone Encounter (Signed)
called pts home lmovm with appts for april2013.  asked pt to rtn call to confirm appt

## 2011-08-27 ENCOUNTER — Encounter (INDEPENDENT_AMBULATORY_CARE_PROVIDER_SITE_OTHER): Payer: Self-pay | Admitting: General Surgery

## 2011-08-27 VITALS — BP 180/106 | HR 64 | Temp 97.7°F | Resp 16 | Ht 66.0 in | Wt 203.0 lb

## 2011-08-27 DIAGNOSIS — E042 Nontoxic multinodular goiter: Secondary | ICD-10-CM

## 2011-08-27 NOTE — Patient Instructions (Signed)
Follow up with your primary care doctor regarding your BP - it needs to be better controlled. Avoid salty foods  We will refer you to an endocrinologist to discuss your thyroid

## 2011-08-28 ENCOUNTER — Other Ambulatory Visit: Payer: Self-pay | Admitting: Emergency Medicine

## 2011-08-28 MED ORDER — METOPROLOL TARTRATE 25 MG PO TABS: 25.0000 mg | ORAL_TABLET | Freq: Two times a day (BID) | ORAL | Status: DC

## 2011-08-28 NOTE — Telephone Encounter (Signed)
Metoprolol refilled electronically 

## 2011-09-01 ENCOUNTER — Ambulatory Visit: Payer: No Typology Code available for payment source | Admitting: Family Medicine

## 2011-09-02 ENCOUNTER — Ambulatory Visit (INDEPENDENT_AMBULATORY_CARE_PROVIDER_SITE_OTHER): Payer: No Typology Code available for payment source | Admitting: Family Medicine

## 2011-09-02 VITALS — BP 160/96 | HR 57 | Temp 98.3°F | Ht 65.0 in | Wt 197.4 lb

## 2011-09-02 DIAGNOSIS — M7741 Metatarsalgia, right foot: Secondary | ICD-10-CM

## 2011-09-02 DIAGNOSIS — M7750 Other enthesopathy of unspecified foot: Secondary | ICD-10-CM

## 2011-09-02 DIAGNOSIS — I10 Essential (primary) hypertension: Secondary | ICD-10-CM

## 2011-09-02 DIAGNOSIS — M7742 Metatarsalgia, left foot: Secondary | ICD-10-CM

## 2011-09-02 MED ORDER — LISINOPRIL-HYDROCHLOROTHIAZIDE 20-25 MG PO TABS: 1.0000 | ORAL_TABLET | Freq: Every day | ORAL | Status: DC

## 2011-09-02 NOTE — Patient Instructions (Signed)
Make appointment with Dr. Aviva Signs on April 8th  You will ned to get fasting bloodwork done and it is important to recheck you labs after taking medicine for 7-10 days  See metatarsal pads  Changed from lisinopril to lisinorpil-HCTZ

## 2011-09-02 NOTE — Assessment & Plan Note (Addendum)
New.  Gave patient a pair of metatarsal pads, placed right in shoes for her and discussed proper placement.  If no improvement, will refer to sports medicine for further eval.

## 2011-09-02 NOTE — Progress Notes (Signed)
  Subjective:    Patient ID: Angelica Park, female    DOB: Dec 12, 1955, 56 y.o.   MRN: [id number]  HPI Here fr follow-up of hypertension and foot pain  HYPERTENSION  Notes to be elevated in past and at last specialist visit.  BP Readings from Last 3 Encounters:  09/02/11 160/96  08/27/11 180/106  05/07/11 150/90    Hypertension ROS: taking medications as instructed, no medication side effects noted, no TIA's, no chest pain on exertion, no dyspnea on exertion and no swelling of ankles.   Right > left foot pain:  History of right plantar  Fasciitis pain, now resolved after steroid injection several months ago.  No with achiness on balls of feet.  No swelling, no new physical activities.    Review of Systems see hpi     Objective:   Physical Exam  GEN: Alert & Oriented, No acute distress CV:  Regular Rate & Rhythm, no murmur Respiratory:  Normal work of breathing, CTAB Abd:  + BS, soft, no tenderness to palpation Ext: no pre-tibial edema Feet:  No pain on palpation of dorsal surface of feet, plantar fascia.  Right foot with pain under 2-4th.   Metatarsal.  No plantar wart or callous.   Pes planus.  Wide shoes, appropriate support     Assessment & Plan:

## 2011-09-02 NOTE — Assessment & Plan Note (Addendum)
poor control- patient admits to only taking BP meds at times only once per week.  She asks if this is ok- discussed that in order to get most benefit must take them daily.   Has been taking daily for past several days, will increase lisinopril from 10 mg to 20mg  and add back HCTZ.  Will follow-up in 7-10 days with PCP for creatinine/K and BP check.

## 2011-09-11 ENCOUNTER — Ambulatory Visit: Payer: No Typology Code available for payment source | Admitting: Family Medicine

## 2011-09-12 ENCOUNTER — Ambulatory Visit (INDEPENDENT_AMBULATORY_CARE_PROVIDER_SITE_OTHER): Payer: No Typology Code available for payment source | Admitting: *Deleted

## 2011-09-12 ENCOUNTER — Other Ambulatory Visit: Payer: No Typology Code available for payment source

## 2011-09-12 VITALS — BP 150/90 | HR 68

## 2011-09-12 DIAGNOSIS — I10 Essential (primary) hypertension: Secondary | ICD-10-CM

## 2011-09-12 LAB — COMPREHENSIVE METABOLIC PANEL
ALT: 14 U/L (ref 0–35)
AST: 19 U/L (ref 0–37)
Creat: 0.82 mg/dL (ref 0.50–1.10)
Total Bilirubin: 0.3 mg/dL (ref 0.3–1.2)

## 2011-09-12 LAB — LIPID PANEL
Cholesterol: 192 mg/dL (ref 0–200)
HDL: 46 mg/dL (ref >39)
Total CHOL/HDL Ratio: 4.2 Ratio
VLDL: 12 mg/dL (ref 0–40)

## 2011-09-12 NOTE — Progress Notes (Signed)
cmp and flp done today Angelica Park 

## 2011-09-12 NOTE — Progress Notes (Signed)
Patient in for lab draw and requested BP check. BP checked manually using large adult cuff.  BP manually 150/90 bilaterally , pulse 68. Has not taken BP meds this AM because she takes with food and she was NPO this AM.  Will send message  to Dr. Aviva Signs . Needs refill on lisinopril /HCTZ. Before end of the month.

## 2011-09-12 NOTE — Progress Notes (Signed)
Addended by: Macy Mis on: 09/12/2011 08:47 AM   Modules accepted: Orders

## 2011-09-15 ENCOUNTER — Ambulatory Visit: Payer: No Typology Code available for payment source | Admitting: Family Medicine

## 2011-09-15 ENCOUNTER — Encounter: Payer: Self-pay | Admitting: Family Medicine

## 2011-09-15 DIAGNOSIS — R7301 Impaired fasting glucose: Secondary | ICD-10-CM | POA: Insufficient documentation

## 2011-09-24 ENCOUNTER — Encounter: Payer: Self-pay | Admitting: Family Medicine

## 2011-09-24 ENCOUNTER — Other Ambulatory Visit: Payer: Self-pay | Admitting: Family Medicine

## 2011-09-24 MED ORDER — LISINOPRIL-HYDROCHLOROTHIAZIDE 20-25 MG PO TABS: 1.0000 | ORAL_TABLET | Freq: Every day | ORAL | Status: DC

## 2011-10-06 ENCOUNTER — Telehealth: Payer: Self-pay | Admitting: Oncology

## 2011-10-06 ENCOUNTER — Ambulatory Visit (HOSPITAL_BASED_OUTPATIENT_CLINIC_OR_DEPARTMENT_OTHER): Payer: Self-pay | Admitting: Oncology

## 2011-10-06 ENCOUNTER — Other Ambulatory Visit: Payer: Self-pay | Admitting: Lab

## 2011-10-06 ENCOUNTER — Encounter: Payer: Self-pay | Admitting: Oncology

## 2011-10-06 VITALS — BP 149/79 | HR 73 | Temp 98.5°F | Ht 65.0 in | Wt 200.8 lb

## 2011-10-06 DIAGNOSIS — N6099 Unspecified benign mammary dysplasia of unspecified breast: Secondary | ICD-10-CM | POA: Insufficient documentation

## 2011-10-06 DIAGNOSIS — N6089 Other benign mammary dysplasias of unspecified breast: Secondary | ICD-10-CM

## 2011-10-06 LAB — CBC WITH DIFFERENTIAL/PLATELET
Basophils Absolute: 0 10*3/uL (ref 0.0–0.1)
EOS%: 1 % (ref 0.0–7.0)
LYMPH%: 50 % — ABNORMAL HIGH (ref 14.0–49.7)
MCHC: 33 g/dL (ref 31.5–36.0)
MCV: 92.1 fL (ref 79.5–101.0)
MONO#: 0.5 10*3/uL (ref 0.1–0.9)
NEUT#: 2.1 10*3/uL (ref 1.5–6.5)
NEUT%: 39.5 % (ref 38.4–76.8)
RBC: 4.04 10*6/uL (ref 3.70–5.45)
WBC: 5.4 10*3/uL (ref 3.9–10.3)
lymph#: 2.7 10*3/uL (ref 0.9–3.3)

## 2011-10-06 LAB — COMPREHENSIVE METABOLIC PANEL
Albumin: 4.4 g/dL (ref 3.5–5.2)
BUN: 15 mg/dL (ref 6–23)
Calcium: 10 mg/dL (ref 8.4–10.5)
Chloride: 104 mEq/L (ref 96–112)
Glucose, Bld: 82 mg/dL (ref 70–99)
Potassium: 4 mEq/L (ref 3.5–5.3)

## 2011-10-06 NOTE — Telephone Encounter (Signed)
gve the pt her may 2014 appt calendar 

## 2011-10-06 NOTE — Progress Notes (Signed)
OFFICE PROGRESS NOTE  CC  Angelica Abed, MD, MD [address] [place] [address]  DIAGNOSIS: 56 year old female with atypical ductal hyperplasia.  PRIOR THERAPY:  #1 status post lumpectomy for atypical ductal hyperplasia. She was then recommended starting Evista but she cannot afford it and therefore she has not attended therapy up-to-date.  CURRENT THERAPY:Observation is patient cannot afford Evista  INTERVAL HISTORY: ETHER GOEBEL 56 y.o. female returns forFollowup visit. She is clinically doing well. Unfortunately she has not started her chemoprevention with Evista. She is postmenopausal. She overall feels well  MEDICAL HISTORY: Past Medical History  Diagnosis Date  . History of DVT (deep vein thrombosis)     Had PE on OCP's  . Hypertension   . Hemorrhoid   . GERD (gastroesophageal reflux disease)   . Atypical ductal hyperplasia of breast 08/2010    right    ALLERGIES:  is allergic to codeine.  MEDICATIONS:  Current Outpatient Prescriptions  Medication Sig Dispense Refill  . calcium-vitamin D 250-100 MG-UNIT per tablet Take 1 tablet by mouth 2 (two) times daily.      Marland Kitchen GLUCOSAMINE PO Take by mouth daily.      Marland Kitchen lisinopril-hydrochlorothiazide (PRINZIDE,ZESTORETIC) 20-25 MG per tablet Take 1 tablet by mouth daily.  30 tablet  6  . metoprolol tartrate (LOPRESSOR) 25 MG tablet Take 1 tablet (25 mg total) by mouth 2 (two) times daily.  60 tablet  3  . MILK THISTLE PO Take by mouth daily.      . vitamin E 400 UNIT capsule Take 400 Units by mouth daily.        SURGICAL HISTORY:  Past Surgical History  Procedure Date  . Abdominal hysterectomy     fibroids  . Breast surgery 4/12    removal of lumps in breast   . Biopsy thyroid      ECOG PERFORMANCE STATUS: 0 - Asymptomatic  Blood pressure 149/79, pulse 73, temperature 98.5 F (36.9 C), temperature source Oral, height 5\' 5"  (1.651 m), weight 200 lb 12.8 oz (91.082 kg).  LABORATORY DATA: Lab Results    Component Value Date   WBC 5.4 10/06/2011   HGB 12.3 10/06/2011   HCT 37.2 10/06/2011   MCV 92.1 10/06/2011   PLT 210 10/06/2011      Chemistry      Component Value Date/Time   NA 143 09/12/2011 0848   K 3.7 09/12/2011 0848   CL 108 09/12/2011 0848   CO2 29 09/12/2011 0848   BUN 14 09/12/2011 0848   CREATININE 0.82 09/12/2011 0848   CREATININE 0.73 02/26/2011 1533      Component Value Date/Time   CALCIUM 9.7 09/12/2011 0848   ALKPHOS 42 09/12/2011 0848   AST 19 09/12/2011 0848   ALT 14 09/12/2011 0848   BILITOT 0.3 09/12/2011 0848       RADIOGRAPHIC STUDIES:  No results found.  ASSESSMENT: 56 year old female with atypical ductal hyperplasia. She was recommended to begin chemoprevention with Evista 60 mg daily unfortunately patient has not started it. She tells me that she is not able to afford the medication as it costs about $500 out of pocket and she does not have insurance.   PLAN: I have referred her to our financial counselor hopefully they can get her the help that she needs. In the meantime I will see her back in one years time   All questions were answered. The patient knows to call the clinic with any problems, questions or concerns. We can certainly  see the patient much sooner if necessary.  I spent 20 minutes counseling the patient face to face. The total time spent in the appointment was 30 minutes.    Drue Second, MD Medical/Oncology [facility] [phone] (beeper) [phone] (Office)  10/06/2011, 2:51 PM

## 2011-10-06 NOTE — Patient Instructions (Addendum)
See me back in 1 year. Refer to financial counselor

## 2011-10-09 ENCOUNTER — Telehealth: Payer: Self-pay | Admitting: Oncology

## 2011-10-09 NOTE — Telephone Encounter (Signed)
Called patient left message for patient to give me a call back in reference to her reapplying for financial assistance.

## 2011-10-10 ENCOUNTER — Encounter: Payer: Self-pay | Admitting: Oncology

## 2011-10-10 NOTE — Progress Notes (Signed)
Called patient again today left message for patient to give me a call back about reapplying for assistance.

## 2011-10-10 NOTE — Progress Notes (Signed)
Spoke with patient today, patient will be coming by Monday to pick up [employer] form for co-pay assistance, also explained to patient that her discount with [employer] has expired and she needs to update it or reapply.

## 2011-10-13 ENCOUNTER — Encounter: Payer: Self-pay | Admitting: Oncology

## 2011-10-13 NOTE — Progress Notes (Signed)
Patient came by today, patient has [employer] discount at 100%, gave patient [employer] application for co-pay assistance. She will fill out her part and bring back.

## 2011-11-11 ENCOUNTER — Ambulatory Visit (INDEPENDENT_AMBULATORY_CARE_PROVIDER_SITE_OTHER): Payer: Self-pay | Admitting: *Deleted

## 2011-11-11 DIAGNOSIS — Z111 Encounter for screening for respiratory tuberculosis: Secondary | ICD-10-CM

## 2011-11-13 ENCOUNTER — Ambulatory Visit (INDEPENDENT_AMBULATORY_CARE_PROVIDER_SITE_OTHER): Payer: Self-pay | Admitting: *Deleted

## 2011-11-13 DIAGNOSIS — IMO0001 Reserved for inherently not codable concepts without codable children: Secondary | ICD-10-CM

## 2011-11-13 DIAGNOSIS — Z111 Encounter for screening for respiratory tuberculosis: Secondary | ICD-10-CM

## 2011-11-13 LAB — TB SKIN TEST
Induration: 0 mm
TB Skin Test: NEGATIVE

## 2011-11-27 ENCOUNTER — Encounter (INDEPENDENT_AMBULATORY_CARE_PROVIDER_SITE_OTHER): Payer: Self-pay | Admitting: General Surgery

## 2011-12-03 ENCOUNTER — Telehealth (INDEPENDENT_AMBULATORY_CARE_PROVIDER_SITE_OTHER): Payer: Self-pay | Admitting: General Surgery

## 2011-12-03 NOTE — Telephone Encounter (Signed)
Left message for patient to call back and ask for me 

## 2011-12-03 NOTE — Telephone Encounter (Signed)
Spoke with patient. We saw her on 08/27/11 and referred her to an endocrinologist to manage her thyroid. Patient never followed up on that appt and now wants a letter stating we are treating her for her thyroid condition for a job requirement. I gave her the names and numbers of two endocrinologists, Dr Talmage Nap and Dr Everardo All, and stressed the importance of her getting in to see an endocrinologist. She stated she would call them. Made her aware I would write her a letter stating she was seen on 08/27/11 for her thyroid. She will pick up letter from our office.

## 2011-12-03 NOTE — Telephone Encounter (Signed)
Message copied by Liliana Cline on Wed Dec 03, 2011  7:35 AM ------      Message from: Zacarias Pontes      Created: Tue Dec 02, 2011  8:54 AM       Pt needs some kind of note stating that she is under Drs care for her thyroid....you can reach her at [phone]

## 2011-12-10 ENCOUNTER — Encounter: Payer: Self-pay | Admitting: Family Medicine

## 2011-12-10 ENCOUNTER — Ambulatory Visit (INDEPENDENT_AMBULATORY_CARE_PROVIDER_SITE_OTHER): Payer: Self-pay | Admitting: Family Medicine

## 2011-12-10 VITALS — BP 144/74 | HR 72 | Ht 66.0 in | Wt 194.9 lb

## 2011-12-10 DIAGNOSIS — E669 Obesity, unspecified: Secondary | ICD-10-CM

## 2011-12-10 DIAGNOSIS — K219 Gastro-esophageal reflux disease without esophagitis: Secondary | ICD-10-CM

## 2011-12-10 DIAGNOSIS — I10 Essential (primary) hypertension: Secondary | ICD-10-CM

## 2011-12-10 DIAGNOSIS — E010 Iodine-deficiency related diffuse (endemic) goiter: Secondary | ICD-10-CM

## 2011-12-10 DIAGNOSIS — E785 Hyperlipidemia, unspecified: Secondary | ICD-10-CM

## 2011-12-10 DIAGNOSIS — E049 Nontoxic goiter, unspecified: Secondary | ICD-10-CM

## 2011-12-10 DIAGNOSIS — N6089 Other benign mammary dysplasias of unspecified breast: Secondary | ICD-10-CM

## 2011-12-10 DIAGNOSIS — N6099 Unspecified benign mammary dysplasia of unspecified breast: Secondary | ICD-10-CM

## 2011-12-10 NOTE — Patient Instructions (Addendum)
It has been a pleasure to see you today. Please take the medications as prescribed. You will be starting a new medication for your cholesterol. Take it as prescribed. Keep a log of your blood pressure and your pulse and we will discuss it on  next visit. Make your next appointment in 3 months or sooner if needed.

## 2011-12-11 ENCOUNTER — Encounter: Payer: Self-pay | Admitting: Family Medicine

## 2011-12-11 DIAGNOSIS — E669 Obesity, unspecified: Secondary | ICD-10-CM | POA: Insufficient documentation

## 2011-12-11 MED ORDER — PRAVASTATIN SODIUM 10 MG PO TABS: 10.0000 mg | ORAL_TABLET | Freq: Every day | ORAL | Status: DC

## 2011-12-11 NOTE — Assessment & Plan Note (Addendum)
BP systolic today on 144. Normal diastolic. Pt concerned about low HR. Normal and rhythmic pulse today. Plan: Continue current treatment. Instructed about BP and pulse log. Next step will be to go up on Lisinopril dose if BP not controlled.

## 2011-12-11 NOTE — Assessment & Plan Note (Signed)
Pt has financial limitations to afford treatment with Evista as recommended by Oncology. Next f/u in a year.

## 2011-12-11 NOTE — Assessment & Plan Note (Addendum)
Loss 5 lb since April/2013. Still with BMI on the obese range. I am concerned that with her other comorbidities this could change.  Plan: Counseled on healthy food choices and excersise. We would f/u trend. Will check A1C next visit.

## 2011-12-11 NOTE — Progress Notes (Signed)
  Subjective:    Patient ID: Angelica Park, female    DOB: 1956/05/09, 56 y.o.   MRN: [id number]  HPI This is my first time seen Angelica Park. 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  1. She has a hx of 2 masses on right breast s/p anterior and posterior lumpectomy and was diagnosed with atypical ductal hyperplasia 08/2010. She was recommended to start Evista but pt can not afford medication. This information is extracted from office visit with Oncologist documented in EPIC (09/2011) since pt told me that what they found on her breast was benign in nature and that she does not need further intervention. Per Onchology (Dr. Konrad Dolores) pt's next f/u appointment will be in 1 year. 2. Euthyroid Goiter: f/u by [facility] ( Dr. Andrey Campanile) since Nov/2012 with needle biopsy results non-diagnostic. Recommended total thyroidectomy and endocrinology management. Pt decided close interval  f/u and medical management. Endocrine appointment will be starting on July 12th.  3. HTN: Pt on HCTZ/ Lisinopril (25/20) and Metoprolol 25 mg BID. She tolerates well the medications but was told her HR was low and wanted to decreased the amount of Metoprolol. Denies chest pain or palpitations. 4. HLD: Pt with elevated LDL since 2008 with no pharmacologic treatment. Has never been on statins. She is concerned about side effects of muscular pain of statins.  5  Hx of GERD: pt concerned of new halitosis. She wears dental prothesis but does not complaint of pain, she had a recent visit to the dentist and has no cavities or other dental source of halitosis. She has been diagnosed in the past with GERD and was on treatment, lately she has noticed increase of reflux of acid to the mouth.   Review of Systems  Constitutional: Negative for fatigue and unexpected weight change.  Respiratory: Negative for cough, chest tightness and shortness of breath.   Cardiovascular: Negative for chest pain, palpitations  and leg swelling.  Gastrointestinal: Negative.   Musculoskeletal: Positive for arthralgias.  Neurological: Negative for weakness, numbness and headaches.       Objective:   Physical Exam  Constitutional: She is oriented to person, place, and time. No distress.  HENT:  Mouth/Throat: Oropharynx is clear and moist. No oropharyngeal exudate.       Dentures placed. No percibed halitosis. Pt chewing gum. No lesions observed.  Eyes: Conjunctivae are normal.  Neck: Normal range of motion. Neck supple. No JVD present. No tracheal deviation present. Thyromegaly present.  Cardiovascular: Normal rate, regular rhythm and normal heart sounds.   No murmur heard. Pulmonary/Chest: Effort normal and breath sounds normal.  Abdominal: Soft. Bowel sounds are normal.  Musculoskeletal: Normal range of motion. She exhibits no edema.  Lymphadenopathy:    She has no cervical adenopathy.  Neurological: She is alert and oriented to person, place, and time.  Skin: Skin is warm and dry.  Psychiatric: She has a normal mood and affect.          Assessment & Plan:

## 2011-12-11 NOTE — Assessment & Plan Note (Signed)
Recommended total thyroidectomy. Pt declined procedure and prefer close f/u. She has also an upcoming appointment with endocrinologist. Plan: F/u sub-specialist recommendations.

## 2011-12-11 NOTE — Assessment & Plan Note (Addendum)
Reported halitosis, no present during our physical exam. Negative findings on mouth and oropharynx. no cervical adenopathies. No smoker. Plan: PPI  Omeprazole 20 mg PO daily for 4 weeks and reevaluate.

## 2011-12-11 NOTE — Assessment & Plan Note (Addendum)
Borderline Hight LDL. Two risk factors (age and HTN) for a total of 3% risk of CHD. LDL goal less than 130.  Plan Will initiate treatment with statins since pt has elevated LDL since 2008 with no pharmacologic intervention. Side effects and pt's concerns addressed. Pravastatin 10 mg QHS (empty stomach) will recheck in 8-10 weeks.

## 2011-12-17 ENCOUNTER — Telehealth: Payer: Self-pay | Admitting: Family Medicine

## 2011-12-17 NOTE — Telephone Encounter (Addendum)
Is asking about letter that she is supposed to be getting for her job to say that her BP is lower, neck is large b/c of thyroid (going to specialist Friday) She had given doctor the letter that she needs help with - she needs this by Friday and would like to pick up tomorrow

## 2011-12-18 ENCOUNTER — Encounter: Payer: Self-pay | Admitting: Family Medicine

## 2011-12-18 NOTE — Telephone Encounter (Signed)
Letter made and route to admin and white pool.

## 2011-12-19 ENCOUNTER — Ambulatory Visit (INDEPENDENT_AMBULATORY_CARE_PROVIDER_SITE_OTHER): Payer: Self-pay | Admitting: Endocrinology

## 2011-12-19 ENCOUNTER — Encounter: Payer: Self-pay | Admitting: Endocrinology

## 2011-12-19 VITALS — BP 152/82 | HR 56 | Temp 97.7°F | Ht 66.0 in | Wt 200.0 lb

## 2011-12-19 DIAGNOSIS — E049 Nontoxic goiter, unspecified: Secondary | ICD-10-CM

## 2011-12-19 DIAGNOSIS — E010 Iodine-deficiency related diffuse (endemic) goiter: Secondary | ICD-10-CM

## 2011-12-19 NOTE — Patient Instructions (Addendum)
The biopsy was inconclusive.  Surgery could be considered in this situation.  You could choose to recheck the ultrasound in the future if you wish. Please come back for a follow-up appointment in 6 months.   most of the time, a "lumpy thyroid" will eventually become overactive.  this is usually a slow process, happening over the span of many years.

## 2011-12-19 NOTE — Progress Notes (Signed)
Subjective:    Patient ID: Angelica Park, female    DOB: August 29, 1955, 56 y.o.   MRN: [id number]  HPI Pt was noted 9 mos ago by her dentist to have slight swelling at the anterior neck.  No assoc pain.  US showed multinodular goiter.  She underwent bx of left and isthmus nodules.  cytol result was inconclusive.  Surgery was discussed, but pt declined for the time being.   Past Medical History  Diagnosis Date  . History of DVT (deep vein thrombosis)     Had PE on OCP's  . Hypertension   . Hemorrhoid   . GERD (gastroesophageal reflux disease)   . Atypical ductal hyperplasia of breast 08/2010    right    Past Surgical History  Procedure Date  . Abdominal hysterectomy 1997    fibroids  . Breast surgery 4/12    removal of lumps in breast   . Biopsy thyroid     History   Social History  . Marital Status: Widowed    Spouse Name: N/A    Number of Children: N/A  . Years of Education: GED   Occupational History  . CNA    Social History Main Topics  . Smoking status: Never Smoker   . Smokeless tobacco: Never Used  . Alcohol Use: Yes     Occasional. CAGE negative.  . Drug Use: No  . Sexually Active: Yes   Other Topics Concern  . 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 calcium-vitamin D 250-100 MG-UNIT per tablet Take 1 tablet by mouth 2 (two) times daily.      Marland Kitchen GLUCOSAMINE PO Take by mouth daily.      Marland Kitchen lisinopril-hydrochlorothiazide (PRINZIDE,ZESTORETIC) 20-25 MG per tablet Take 1 tablet by mouth daily.  30 tablet  6  . metoprolol tartrate (LOPRESSOR) 25 MG tablet Take 1 tablet (25 mg total) by mouth 2 (two) times daily.  60 tablet  3  . MILK THISTLE PO Take by  mouth daily.      . pravastatin (PRAVACHOL) 10 MG tablet Take 1 tablet (10 mg total) by mouth daily.  30 tablet  3    Allergies  Allergen Reactions  . Codeine     Family History  Problem Relation Age of Onset  . Diabetes Mother   . Stroke Mother   . Hypertension Mother   . Cancer Father     lung  . Diabetes Sister   . Cancer Sister     throat ca  . Hyperlipidemia Sister   . Hypertension Sister   . Cancer Brother     prostate  . Diabetes Brother   . Hypertension Brother   . Cancer Other     niece; breast ca 60yrs  . Kidney disease Other     nieces  benigngoiter: mother and 2 sibs (2 of these 3 family members had surgery, and 1 had RAI rx)  BP 152/82  Pulse 56  Temp 97.7 F (36.5 C) (Oral)  Ht 5\' 6"  (1.676 m)  Wt 200 lb (90.719 kg)  BMI 32.28 kg/m2  SpO2 98%  Review of Systems denies weight loss, headache, hoarseness, double vision, palpitations, sob, diarrhea, polyuria, myalgias, excessive diaphoresis,  numbness, tremor, easy bruising, and rhinorrhea.  She has "hot flashes," and slight tremor.    Objective:   Physical Exam VS: see vs page GEN: no distress HEAD: head: no deformity eyes: no periorbital swelling, no proptosis external nose and ears are normal mouth: no lesion seen NECK: there is a 4 cm thyroid nodule, just to the left of the midline.   CHEST WALL: no deformity LUNGS:  Clear to auscultation CV: reg rate and rhythm, no murmur MUSCULOSKELETAL: muscle bulk and strength are grossly normal.  no obvious joint swelling.  gait is normal and steady EXTEMITIES: no deformity.  no ulcer on the feet.  feet are of normal color and temp.  no edema PULSES: dorsalis pedis intact bilat.  no carotid bruit NEURO:  cn 2-12 grossly intact.   readily moves all 4's.  sensation is intact to touch on the feet SKIN:  Normal texture and temperature.  No rash or suspicious lesion is visible.   NODES:  None palpable at the neck PSYCH: alert, oriented x3.  Does not appear  anxious nor depressed.   (i reviewed cytology adn [place] reports) Lab Results  Component Value Date   TSH 1.565 03/19/2011      Assessment & Plan:  Multinodular goiter, which is usually hereditary.  Probably benign (in view of pos family hx of benign goiter), but this cannot be determined with a high degree of certainty.  She is declining surgery for now, so she needs f/u at least twice a year for now. Menopausal sxs, not thyroid-related. Tremor, not thyroid-related

## 2012-01-02 ENCOUNTER — Telehealth: Payer: Self-pay | Admitting: Endocrinology

## 2012-01-02 NOTE — Telephone Encounter (Signed)
i see this pt only for the thyroid, and it has no impact on her ability to work.

## 2012-01-02 NOTE — Telephone Encounter (Signed)
Pt states that she needs letter stating that she is being treated for her thyroid goiter and irregular heartbeat by a specialist for her employer.

## 2012-01-02 NOTE — Telephone Encounter (Signed)
i printed letter.  i don't see pt for an irreg heartbeat.

## 2012-01-15 ENCOUNTER — Encounter (INDEPENDENT_AMBULATORY_CARE_PROVIDER_SITE_OTHER): Payer: Self-pay | Admitting: General Surgery

## 2012-02-25 ENCOUNTER — Encounter (INDEPENDENT_AMBULATORY_CARE_PROVIDER_SITE_OTHER): Payer: Self-pay | Admitting: General Surgery

## 2012-06-18 ENCOUNTER — Ambulatory Visit: Payer: Self-pay | Admitting: Endocrinology

## 2012-10-07 ENCOUNTER — Ambulatory Visit: Payer: Self-pay | Admitting: Oncology

## 2013-04-20 IMAGING — US US SOFT TISSUE HEAD/NECK
1 series · 14 of 25 positions shown · non-contrast
Comparison: 03/27/2011

CLINICAL DATA: Goiter, multiple nodules.  Previous FNA biopsy of
the isthmus and left lobe lesions.

THYROID ULTRASOUND
TECHNIQUE: Ultrasound examination of the thyroid gland and adjacent
soft tissues was performed.

[Series 1: us soft tissue head/neck · 0.11mm/px · 14 of 86 slices shown]
[im 1/86]
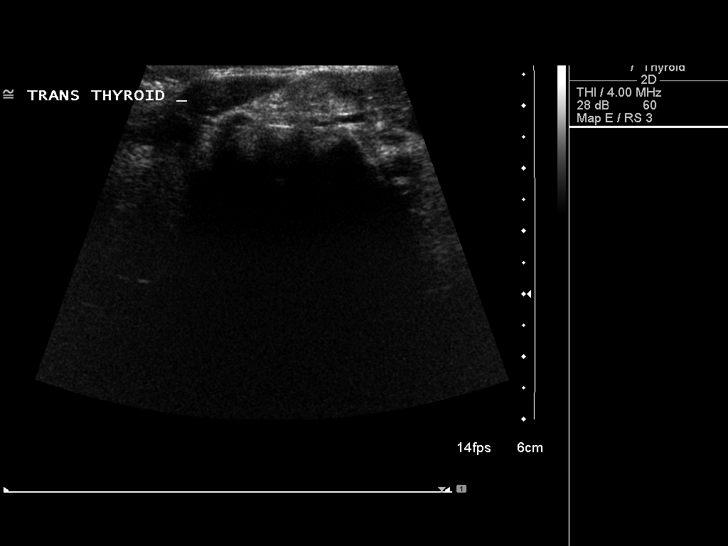
[im 8/86]
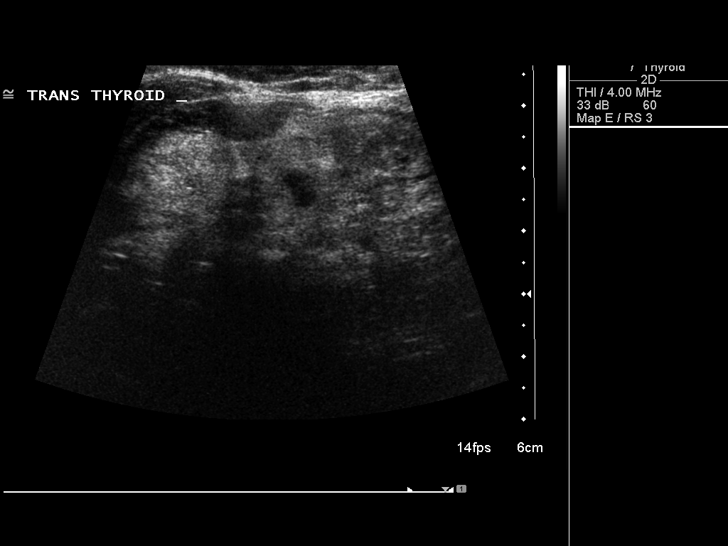
[im 15/86]
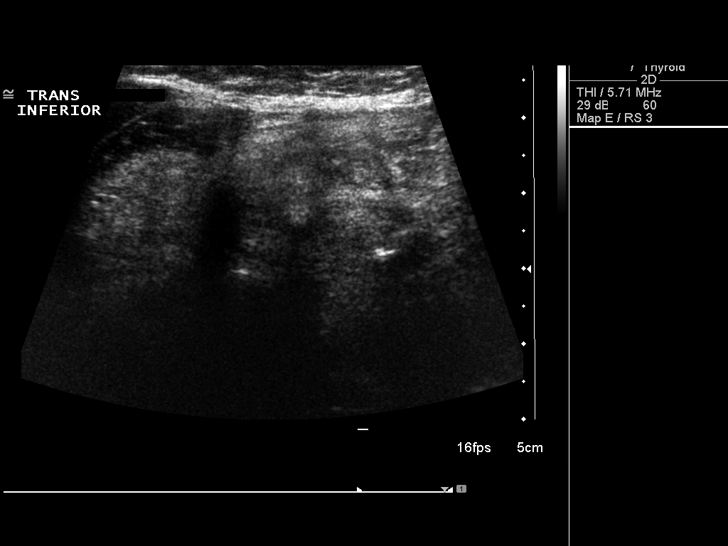
[im 22/86]
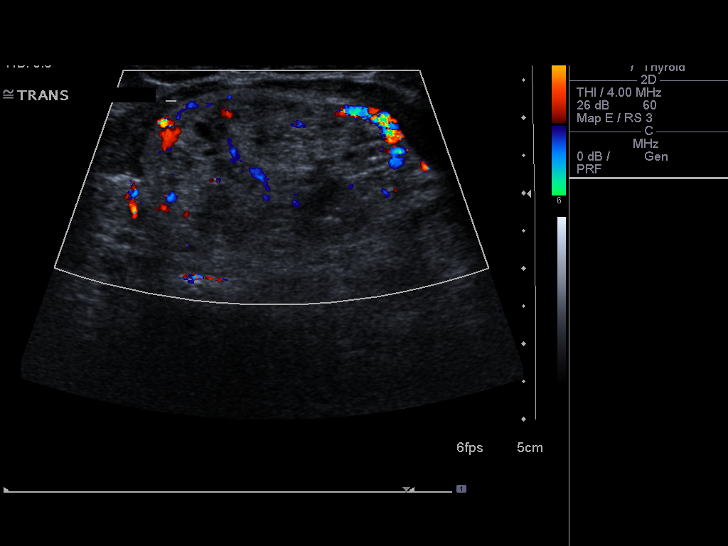
[im 29/86]
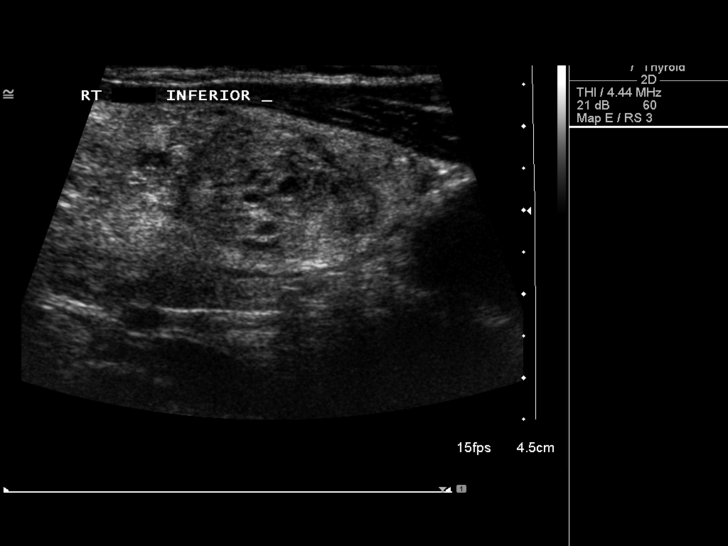
[im 32/86]
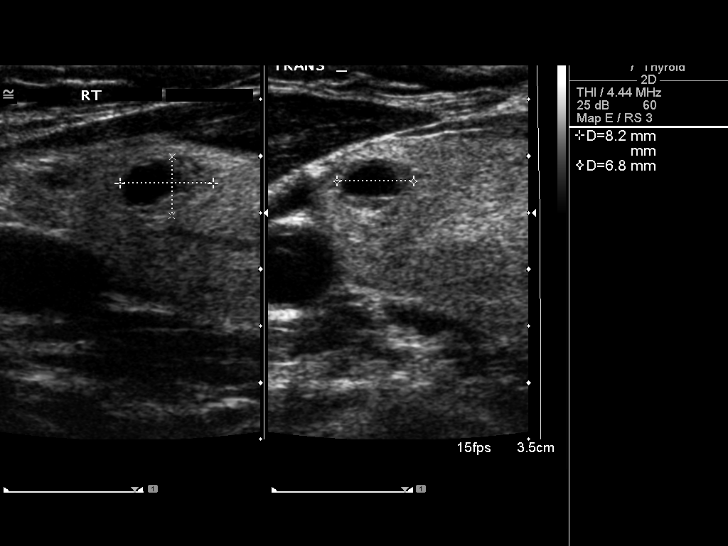
[im 39/86]
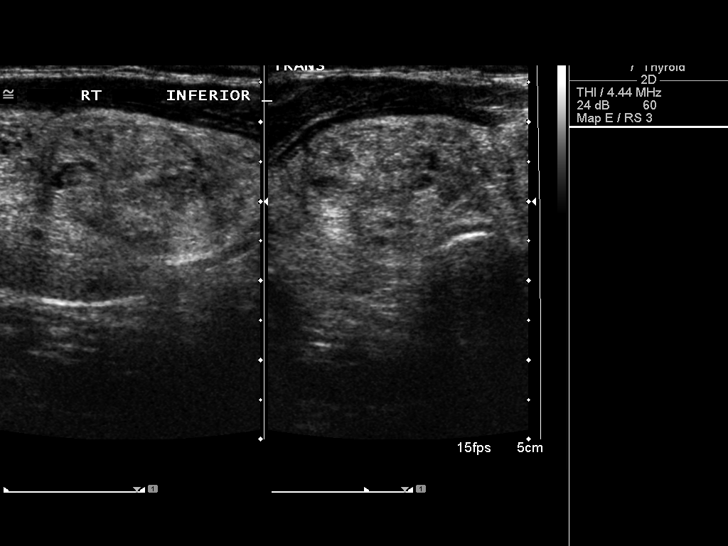
[im 47/86]
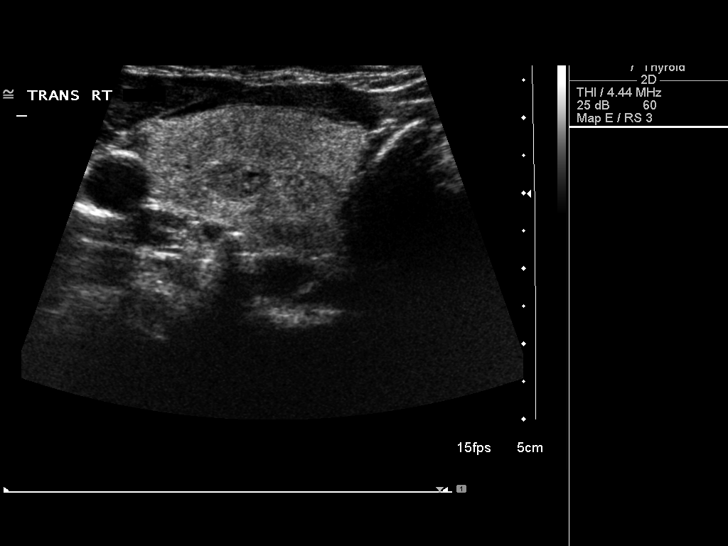
[im 54/86]
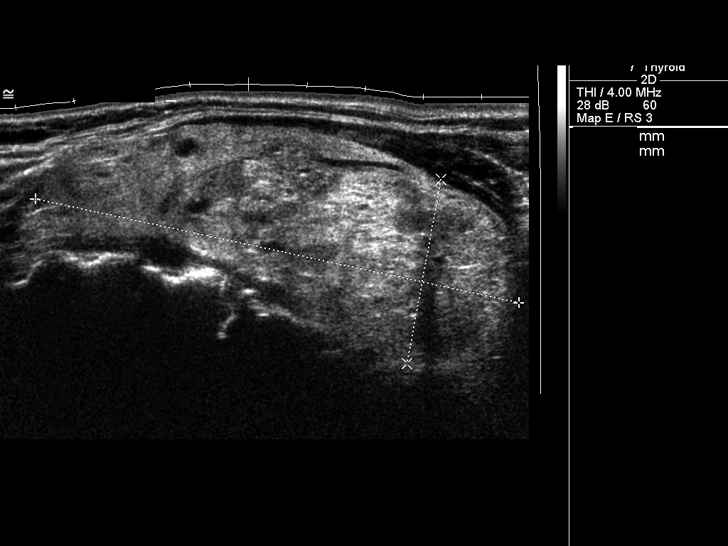
[im 57/86]
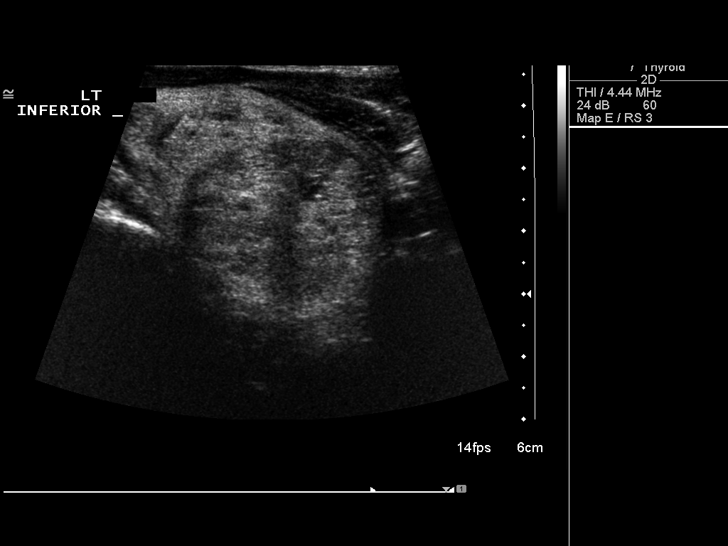
[im 64/86]
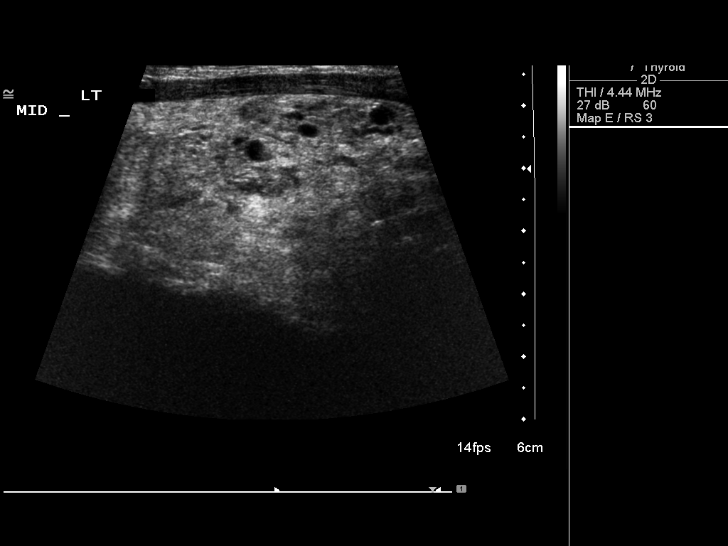
[im 71/86]
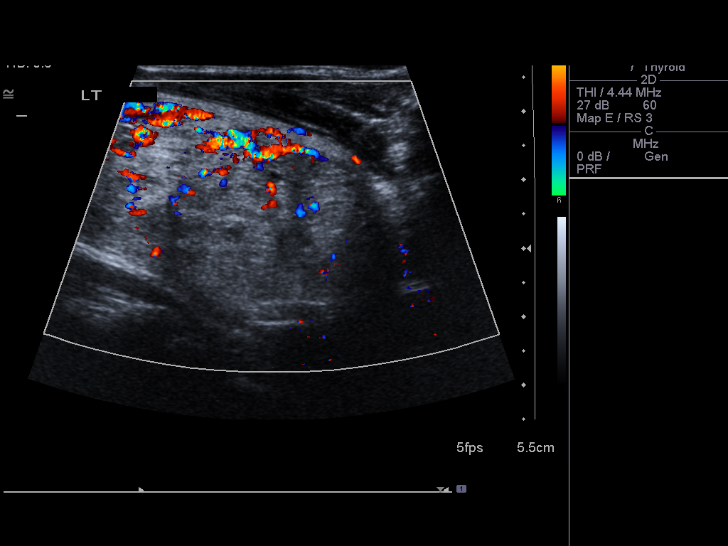
[im 78/86]
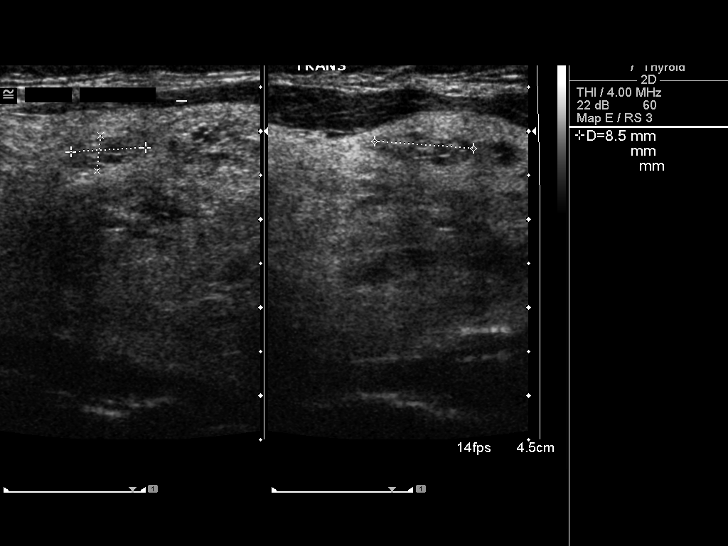
[im 86/86]
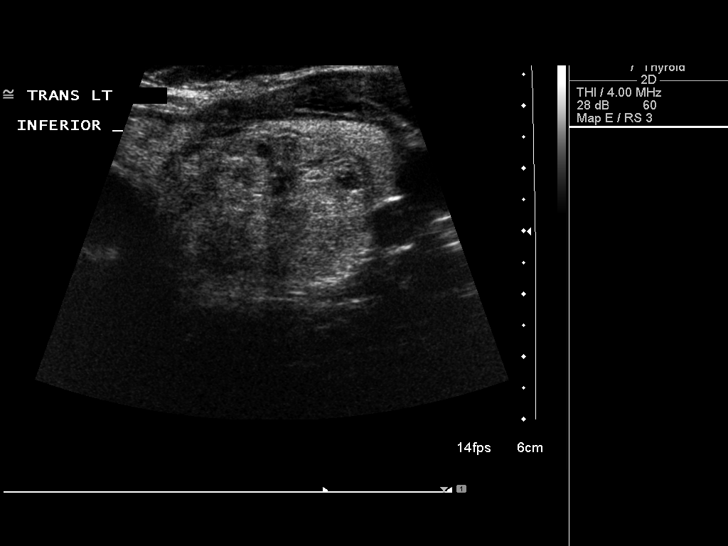

[14 of 25 positions shown; findings below may reference images not displayed]

FINDINGS: Right thyroid lobe:  27 x 33 x 81 mm, inhomogeneous
Left thyroid lobe:  32 x 46 x 84 mm
Isthmus:  26 mm in thickness

Focal nodules:  24 x 34 x 44 mm solid, isthmus (previously 24 x 35
x 44)
14 x 19 x 24 mm solid, mid-right (previously 12 x 20 x 26)
29 x 32 x 35 mm solid with calcifications, inferior left
(previously 29 x 29 x 35)
11 x 14 x 17 mm solid, superior left
Additional smaller sub centimeter lesions are noted bilaterally.

Lymphadenopathy:  None visualized.
IMPRESSION: 1.  Multiple bilateral thyroid nodules with little change in
dominant lesions as above.  Correlate with previous biopsy result.

## 2013-05-27 ENCOUNTER — Ambulatory Visit (INDEPENDENT_AMBULATORY_CARE_PROVIDER_SITE_OTHER): Payer: BC Managed Care – PPO | Admitting: Family Medicine

## 2013-05-27 ENCOUNTER — Encounter: Payer: Self-pay | Admitting: Family Medicine

## 2013-05-27 VITALS — BP 160/88 | HR 82 | Temp 98.2°F | Ht 66.0 in | Wt 200.5 lb

## 2013-05-27 DIAGNOSIS — R7301 Impaired fasting glucose: Secondary | ICD-10-CM

## 2013-05-27 DIAGNOSIS — R3589 Other polyuria: Secondary | ICD-10-CM | POA: Insufficient documentation

## 2013-05-27 DIAGNOSIS — R358 Other polyuria: Secondary | ICD-10-CM

## 2013-05-27 DIAGNOSIS — E785 Hyperlipidemia, unspecified: Secondary | ICD-10-CM

## 2013-05-27 DIAGNOSIS — I10 Essential (primary) hypertension: Secondary | ICD-10-CM

## 2013-05-27 LAB — POCT GLYCOSYLATED HEMOGLOBIN (HGB A1C): Hemoglobin A1C: 5.7

## 2013-05-27 LAB — POCT URINALYSIS DIPSTICK
Leukocytes, UA: NEGATIVE
Nitrite, UA: NEGATIVE
Protein, UA: NEGATIVE
Urobilinogen, UA: 1
pH, UA: 8

## 2013-05-27 LAB — LIPID PANEL: Total CHOL/HDL Ratio: 4.3 Ratio

## 2013-05-27 MED ORDER — AMLODIPINE BESYLATE 10 MG PO TABS: 10.0000 mg | ORAL_TABLET | Freq: Every day | ORAL | Status: DC

## 2013-05-27 NOTE — Progress Notes (Signed)
   Subjective:    Patient ID: Angelica Park, female    DOB: [birth year], 57 y.o.   MRN: [id number]  HPI 57 yo F presents for f/u visit:  1. Polyuria: x 3 weeks. Improved 3 days ago. No dysuria. No polydipsia or polyphagia. No history of DM2.   2. HTN: no meds x 3 weeks. Reports muscle aches with prinzide. Reports low HR and fatigue with metoprolol. No CP, HA, SOB, LE edema. No history of DM, confirmed CAD, CVA or CHF.   3. Knee pain-deferred to f/u with PCP.  4. Health care maintenance: deferred to PCP f/u due for flu, mammogram, colonoscopy.   Soc hx: non smoker  Review of Systems As per HPI     Objective:   Physical Exam BP 160/88  Pulse 82  Temp(Src) 98.2 F (36.8 C) (Oral)  Ht 5\' 6"  (1.676 m)  Wt 200 lb 8 oz (90.946 kg)  BMI 32.38 kg/m2 General appearance: alert, cooperative and no distress Back: symmetric, no curvature. ROM normal. No CVA tenderness. Lungs: clear to auscultation bilaterally Heart: regular rate and rhythm, S1, S2 normal, no murmur, click, rub or gallop Abdomen: soft, non-tender; bowel sounds normal; no masses,  no organomegaly Extremities: extremities normal, atraumatic, no cyanosis or edema    Assessment & Plan:

## 2013-05-27 NOTE — Assessment & Plan Note (Addendum)
A: declined. Patient stopped both prinzide of metoprolol 3 weeks ago. She reports cramps with prinzide. She had some bradycardia with metoprolol.   P: Cautioned against abrupt cessation of medication Started Norvasc 10 mg daily.  Repeat CBC and CMP.

## 2013-05-27 NOTE — Assessment & Plan Note (Signed)
A: not compliant with statin. 10 year CVD risk 8.7 based on today's BP, not on meds, and last lipid panel. Moderate to high intensity statin recommended.  P:  Repeat lipid panel Discussed need for statin compliance. Will redose statin based on repeat lipid panel, likely pravastatin 40

## 2013-05-27 NOTE — Assessment & Plan Note (Addendum)
A: frequent urination x 3 weeks. Improved 3 days ago. No dysuria. Mild constipation x 2 days only. UA and A1c normal. P:  Reassurance and f/u prn.

## 2013-05-27 NOTE — Patient Instructions (Addendum)
Angelica Park,  Thank you for coming in today.  Your urine is normal. Since you are not going as often now there is no further testing needed at this time.   I have ordered norvasc for your hypertension one tab daily. This will replace both prinzide of metoprolol for now. Please take it, follow up with Dr. Aviva Signs in 2-3 weeks.   If you are not taking your medication please call to let Dr. Aviva Signs know. It is important not to go without your medication or stop it abruptly as this can worse your hypertension.   I will be in touch with blood work results. Based on your last cholesterol check you should be on a statin. We will recheck today.   Dr. Armen Pickup

## 2013-05-30 ENCOUNTER — Telehealth: Payer: Self-pay | Admitting: Family Medicine

## 2013-05-30 ENCOUNTER — Other Ambulatory Visit: Payer: Self-pay | Admitting: Family Medicine

## 2013-05-30 DIAGNOSIS — E785 Hyperlipidemia, unspecified: Secondary | ICD-10-CM

## 2013-05-30 MED ORDER — PRAVASTATIN SODIUM 10 MG PO TABS: 10.0000 mg | ORAL_TABLET | Freq: Every day | ORAL | Status: DC

## 2013-05-30 NOTE — Telephone Encounter (Signed)
Medication sent to [facility] at [facility].

## 2013-05-30 NOTE — Telephone Encounter (Signed)
Reviewed. Patient called.  10 CVD risk 8.9% Recommend pravastatin 40 mg daily. Not available at [facility].  Asked patient to call back with preferred pharmacy [facility], [facility] and [facility] (pravastatin is available at all of these pharmacies).   Will route note to patient's PCP to f/u.

## 2014-07-14 ENCOUNTER — Encounter: Payer: Self-pay | Admitting: Family Medicine

## 2014-07-14 ENCOUNTER — Ambulatory Visit
Admission: RE | Admit: 2014-07-14 | Discharge: 2014-07-14 | Disposition: A | Discharge status: Home / Self Care | Payer: 59 | Source: Ambulatory Visit | Attending: Family Medicine | Admitting: Family Medicine

## 2014-07-14 ENCOUNTER — Ambulatory Visit (INDEPENDENT_AMBULATORY_CARE_PROVIDER_SITE_OTHER): Payer: 59 | Admitting: Family Medicine

## 2014-07-14 VITALS — BP 173/81 | HR 81 | Temp 98.4°F | Ht 66.0 in | Wt 202.5 lb

## 2014-07-14 DIAGNOSIS — M25561 Pain in right knee: Secondary | ICD-10-CM

## 2014-07-14 DIAGNOSIS — M25562 Pain in left knee: Principal | ICD-10-CM

## 2014-07-14 DIAGNOSIS — M170 Bilateral primary osteoarthritis of knee: Secondary | ICD-10-CM

## 2014-07-14 DIAGNOSIS — I10 Essential (primary) hypertension: Secondary | ICD-10-CM

## 2014-07-14 MED ORDER — NAPROXEN 375 MG PO TABS: 375.0000 mg | ORAL_TABLET | Freq: Two times a day (BID) | ORAL | Status: DC

## 2014-07-14 MED ORDER — TROLAMINE SALICYLATE 10 % EX CREA: 1.0000 "application " | TOPICAL_CREAM | CUTANEOUS | Status: DC | PRN

## 2014-07-14 MED ORDER — AMLODIPINE BESYLATE 10 MG PO TABS: 10.0000 mg | ORAL_TABLET | Freq: Every day | ORAL | Status: DC

## 2014-07-14 NOTE — Patient Instructions (Addendum)
Thank you so much for coming to see me today! I believe your knee pain may be due to arthritis. We will get xrays of both knees so we can know for sure. I will send a medication to your pharmacy for you to pick up. While it is hard to exercise, it is important for you to keep trying despite the knee pain as this may help!  I have included some information below!  Please follow up for pap smear and to evaluate your increased urination.   Thanks again! Dr. Caroleen Hammanumley  Osteoarthritis Osteoarthritis is a disease that causes soreness and inflammation of a joint. It occurs when the cartilage at the affected joint wears down. Cartilage acts as a cushion, covering the ends of bones where they meet to form a joint. Osteoarthritis is the most common form of arthritis. It often occurs in older people. The joints affected most often by this condition include those in the:  Ends of the fingers.  Thumbs.  Neck.  Lower back.  Knees.  Hips. CAUSES  Over time, the cartilage that covers the ends of bones begins to wear away. This causes bone to rub on bone, producing pain and stiffness in the affected joints.  RISK FACTORS Certain factors can increase your chances of having osteoarthritis, including:  Older age.  Excessive body weight.  Overuse of joints.  Previous joint injury. SIGNS AND SYMPTOMS   Pain, swelling, and stiffness in the joint.  Over time, the joint may lose its normal shape.  Small deposits of bone (osteophytes) may grow on the edges of the joint.  Bits of bone or cartilage can break off and float inside the joint space. This may cause more pain and damage. DIAGNOSIS  Your health care provider will do a physical exam and ask about your symptoms. Various tests may be ordered, such as:  X-rays of the affected joint.  An MRI scan.  Blood tests to rule out other types of arthritis.  Joint fluid tests. This involves using a needle to draw fluid from the joint and examining  the fluid under a microscope. TREATMENT  Goals of treatment are to control pain and improve joint function. Treatment plans may include:  A prescribed exercise program that allows for rest and joint relief.  A weight control plan.  Pain relief techniques, such as:  Properly applied heat and cold.  Electric pulses delivered to nerve endings under the skin (transcutaneous electrical nerve stimulation [TENS]).  Massage.  Certain nutritional supplements.  Medicines to control pain, such as:  Acetaminophen.  Nonsteroidal anti-inflammatory drugs (NSAIDs), such as naproxen.  Narcotic or central-acting agents, such as tramadol.  Corticosteroids. These can be given orally or as an injection.  Surgery to reposition the bones and relieve pain (osteotomy) or to remove loose pieces of bone and cartilage. Joint replacement may be needed in advanced states of osteoarthritis. HOME CARE INSTRUCTIONS   Take medicines only as directed by your health care provider.  Maintain a healthy weight. Follow your health care provider's instructions for weight control. This may include dietary instructions.  Exercise as directed. Your health care provider can recommend specific types of exercise. These may include:  Strengthening exercises. These are done to strengthen the muscles that support joints affected by arthritis. They can be performed with weights or with exercise bands to add resistance.  Aerobic activities. These are exercises, such as brisk walking or low-impact aerobics, that get your heart pumping.  Range-of-motion activities. These keep your joints limber.  Balance and agility exercises. These help you maintain daily living skills.  Rest your affected joints as directed by your health care provider.  Keep all follow-up visits as directed by your health care provider. SEEK MEDICAL CARE IF:   Your skin turns red.  You develop a rash in addition to your joint pain.  You have  worsening joint pain.  You have a fever along with joint or muscle aches. SEEK IMMEDIATE MEDICAL CARE IF:  You have a significant loss of weight or appetite.  You have night sweats. FOR MORE INFORMATION   National Institute of Arthritis and Musculoskeletal and Skin Diseases: www.niams.http://www.myers.net/  [employer] on Aging: https://walker.com/  American College of Rheumatology: www.rheumatology.org Document Released: 05/26/2005 Document Revised: 10/10/2013 Document Reviewed: 01/31/2013 [facility] Patient Information 2015 [place], [place]. This information is not intended to replace advice given to you by your health care provider. Make sure you discuss any questions you have with your health care provider.

## 2014-07-14 NOTE — Progress Notes (Signed)
Subjective:     Patient ID: Denzil HughesAnnie M Peretz, female   DOB: 12/28/1955, 59 y.o.   MRN: [id number]  HPI Mrs. Aline AugustHolmes is a 59yo female presenting today for left knee pain. - Has noticed the pain for the last two months, but it has been worsening over the last three weeks - Pain is worse in left knee, but her right knee has also started hurting. She believes this is due to walking differently due to her left knee pain - Worse with ambulation and cold weather - Has tried Epsom Salt + Alcohol mixture, Tylenol 1000mg  BID, and Artic Ice rub. Some relief but not much. - Has history of lower back pain. States this occurs occasionally. Does not seem to be associated with knee pain. - Also notes increased frequency  Review of Systems  Musculoskeletal: Positive for myalgias, back pain, arthralgias and gait problem.       Objective:   Physical Exam  Constitutional: She appears well-developed and well-nourished. No distress.  Cardiovascular: Normal rate, regular rhythm and normal heart sounds.  Exam reveals no gallop and no friction rub.   No murmur heard. Pulmonary/Chest: Effort normal. No respiratory distress. She has no wheezes. She has no rales.  Abdominal: Soft. She exhibits distension. There is no tenderness.  Musculoskeletal: She exhibits tenderness.  Crepitus noted in knees bilaterally L>R. Tenderness over lumbar spine. No tenderness over piriformis muscle.   Psychiatric: She has a normal mood and affect. Her behavior is normal.      Assessment:     Please refer to Problem List for Assessment.     Plan:     Please refer to Problem List for Plan. Encouraged to follow up for Pap Smear and to further discuss increased frequency. Information given for Mammogram.

## 2014-07-15 DIAGNOSIS — M1711 Unilateral primary osteoarthritis, right knee: Secondary | ICD-10-CM | POA: Insufficient documentation

## 2014-07-15 NOTE — Assessment & Plan Note (Addendum)
-   Failed self treatment with Acetaminophen 1000mg  BID - Will obtain bilateral knee xrays  - Degenerative changes noted L>R - Prescriptions sent to pharmacy for Aspercreme and Naproxen 375mg  BID - Discussed side effects of NSAID use - Encouraged exercise - Schedule follow up appointment for pap smear

## 2014-07-15 NOTE — Assessment & Plan Note (Signed)
-   Elevated today at 173/81 - Did not take blood pressure medicine today - Refilled Amlodipine - Discussed importance of taking blood pressure medicine daily - Recheck at next visit

## 2014-09-06 ENCOUNTER — Ambulatory Visit (INDEPENDENT_AMBULATORY_CARE_PROVIDER_SITE_OTHER): Payer: 59 | Admitting: *Deleted

## 2014-09-06 DIAGNOSIS — Z111 Encounter for screening for respiratory tuberculosis: Secondary | ICD-10-CM

## 2014-09-06 NOTE — Progress Notes (Signed)
   PPD placed Left Forearm.  Pt to return 09/08/2014 for reading.  Pt tolerated intradermal injection. Martin, Tamika L, RN          

## 2014-09-08 ENCOUNTER — Encounter: Payer: Self-pay | Admitting: *Deleted

## 2014-09-08 ENCOUNTER — Ambulatory Visit (INDEPENDENT_AMBULATORY_CARE_PROVIDER_SITE_OTHER): Payer: 59 | Admitting: *Deleted

## 2014-09-08 DIAGNOSIS — Z111 Encounter for screening for respiratory tuberculosis: Secondary | ICD-10-CM

## 2014-09-08 DIAGNOSIS — Z7689 Persons encountering health services in other specified circumstances: Secondary | ICD-10-CM

## 2014-09-08 LAB — TB SKIN TEST
INDURATION: 0 mm
TB SKIN TEST: NEGATIVE

## 2014-09-08 NOTE — Progress Notes (Signed)
   PPD Reading Note PPD read and results entered in EpicCare. Result: 0 mm induration. Interpretation: Negative If test not read within 48-72 hours of initial placement, patient advised to repeat in other arm 1-3 weeks after this test. Allergic reaction: no  Janthony Holleman L, RN  

## 2015-05-25 ENCOUNTER — Encounter: Payer: Self-pay | Admitting: Family Medicine

## 2015-05-25 ENCOUNTER — Ambulatory Visit (INDEPENDENT_AMBULATORY_CARE_PROVIDER_SITE_OTHER): Payer: 59 | Admitting: Family Medicine

## 2015-05-25 ENCOUNTER — Other Ambulatory Visit (HOSPITAL_COMMUNITY)
Admission: RE | Admit: 2015-05-25 | Discharge: 2015-05-25 | Disposition: A | Discharge status: Home / Self Care | Payer: 59 | Source: Ambulatory Visit | Attending: Family Medicine | Admitting: Family Medicine

## 2015-05-25 VITALS — BP 158/98 | HR 84 | Temp 98.3°F | Ht 66.0 in | Wt 200.0 lb

## 2015-05-25 DIAGNOSIS — I10 Essential (primary) hypertension: Secondary | ICD-10-CM

## 2015-05-25 DIAGNOSIS — Z124 Encounter for screening for malignant neoplasm of cervix: Secondary | ICD-10-CM

## 2015-05-25 DIAGNOSIS — Z1211 Encounter for screening for malignant neoplasm of colon: Secondary | ICD-10-CM | POA: Diagnosis not present

## 2015-05-25 DIAGNOSIS — E785 Hyperlipidemia, unspecified: Secondary | ICD-10-CM | POA: Diagnosis not present

## 2015-05-25 DIAGNOSIS — R69 Illness, unspecified: Secondary | ICD-10-CM

## 2015-05-25 DIAGNOSIS — Z0000 Encounter for general adult medical examination without abnormal findings: Secondary | ICD-10-CM

## 2015-05-25 LAB — COMPLETE METABOLIC PANEL WITH GFR
ALBUMIN: 3.9 g/dL (ref 3.6–5.1)
ALK PHOS: 57 U/L (ref 33–130)
ALT: 14 U/L (ref 6–29)
AST: 17 U/L (ref 10–35)
BILIRUBIN TOTAL: 0.2 mg/dL (ref 0.2–1.2)
BUN: 12 mg/dL (ref 7–25)
CALCIUM: 9.5 mg/dL (ref 8.6–10.4)
CO2: 31 mmol/L (ref 20–31)
CREATININE: 0.72 mg/dL (ref 0.50–1.05)
Chloride: 105 mmol/L (ref 98–110)
Glucose, Bld: 121 mg/dL — ABNORMAL HIGH (ref 65–99)
Potassium: 3.9 mmol/L (ref 3.5–5.3)
Sodium: 141 mmol/L (ref 135–146)
TOTAL PROTEIN: 6.7 g/dL (ref 6.1–8.1)

## 2015-05-25 LAB — LIPID PANEL
CHOLESTEROL: 183 mg/dL (ref 125–200)
HDL: 45 mg/dL — AB (ref >=46)
LDL CALC: 123 mg/dL (ref <130)
TRIGLYCERIDES: 76 mg/dL (ref <150)
Total CHOL/HDL Ratio: 4.1 Ratio (ref <=5.0)
VLDL: 15 mg/dL (ref <30)

## 2015-05-25 MED ORDER — ATORVASTATIN CALCIUM 40 MG PO TABS: 40.0000 mg | ORAL_TABLET | Freq: Every day | ORAL | Status: DC

## 2015-05-25 MED ORDER — AMLODIPINE BESYLATE 10 MG PO TABS: 10.0000 mg | ORAL_TABLET | Freq: Every day | ORAL | Status: DC

## 2015-05-25 MED ORDER — CALCIUM CITRATE-VITAMIN D 250-100 MG-UNIT PO TABS: 1.0000 | ORAL_TABLET | Freq: Two times a day (BID) | ORAL | Status: DC

## 2015-05-25 NOTE — Patient Instructions (Signed)
Thank you so much for coming to visit me today! We will check some labs today. I will send you a letter with the results. We will also do your pap smear today. I have placed a referral to Gastroenterology so you can get your colonoscopy done. A handout was given concerning mammograms--please call and schedule an appointment. I have sent a prescription for a cholesterol medicine to the pharmacy. Please let me know how it does! Follow up in one month for a nurse's visit so we can check your blood pressure on medication.  Thanks again! Dr. Caroleen Hammanumley

## 2015-05-26 NOTE — Progress Notes (Signed)
Subjective:     Patient ID: Angelica HughesAnnie M Park, female   DOB: 09/10/1955, 59 y.o.   MRN: [id number]  HPI Angelica Park is a 59yo female presenting today for follow up of Hypertension. - Currently prescribed Norvasc 10mg . Has been out of her medication for one month. - Denies headache, chest pain  # Health Maintenance: - Denies history of smoking - Last pap smear 07/2010 - Last mammogram 2012 - No history of colonoscopy - Last CMP 09/2011 - Last Lipid Panel 05/2013. Previously prescribed statin, but states she stopped this medication due to muscle aches. - Takes Tylenol several times daily for Arthritis in knees - Medications reviewed and updated   Review of Systems Per HPI    Objective:   Physical Exam  Constitutional: She appears well-developed and well-nourished. No distress.  HENT:  Head: Normocephalic and atraumatic.  Cardiovascular: Normal rate and regular rhythm.  Exam reveals no gallop and no friction rub.   No murmur heard. Pulmonary/Chest: Effort normal. No respiratory distress. She has no wheezes. She has no rales.  Abdominal: Soft. Bowel sounds are normal. She exhibits no distension. There is no tenderness.  Musculoskeletal: She exhibits no edema.  Neurological: She is alert.  Skin: Skin is warm. No rash noted.  Psychiatric: She has a normal mood and affect. Her behavior is normal.       Assessment and Plan:     HYPERTENSION, BENIGN SYSTEMIC - Elevated to 158/98 today, above goal of  150/90 - Has not taken medication for one month. Prescription of Norvasc 10mg  sent to pharmacy - Follow up in four weeks for follow up blood pressure  Hyperlipidemia - Showed Angelica Park ASCVD calculator based on 2014 lipid panel. 13% risk of MI or heart attack in next ten years. Discussed importance of diet and exercise to lower risk. - Recheck lipid panel today - States she did not tolerate Pravastatin due to muscle aches - Prescription sent for Atorvastatin To contact office if adverse  effects occur.   Healthcare maintenance - Pap smear obtained - GI referral for colonoscopy screening - Handout given concerning mammogram appointments Angelica Park to call and schedule mammogram. - Will recheck CMP today.

## 2015-05-27 DIAGNOSIS — Z0000 Encounter for general adult medical examination without abnormal findings: Secondary | ICD-10-CM | POA: Insufficient documentation

## 2015-05-27 NOTE — Assessment & Plan Note (Signed)
-   Elevated to 158/98 today, above goal of  150/90 - Has not taken medication for one month. Prescription of Norvasc 10mg  sent to pharmacy - Follow up in four weeks for follow up blood pressure

## 2015-05-27 NOTE — Assessment & Plan Note (Addendum)
-   Showed Mrs. Rodak ASCVD calculator based on 2014 lipid panel. 13% risk of MI or heart attack in next ten years. Discussed importance of diet and exercise to lower risk. - Recheck lipid panel today - States she did not tolerate Pravastatin due to muscle aches - Prescription sent for Atorvastatin To contact office if adverse effects occur.

## 2015-05-27 NOTE — Assessment & Plan Note (Signed)
-   Pap smear obtained - GI referral for colonoscopy screening - Handout given concerning mammogram appointments Mrs. Welsch to call and schedule mammogram. - Will recheck CMP today.

## 2015-05-28 LAB — CYTOLOGY - PAP

## 2015-06-05 ENCOUNTER — Encounter: Payer: Self-pay | Admitting: Family Medicine

## 2016-12-09 ENCOUNTER — Ambulatory Visit (INDEPENDENT_AMBULATORY_CARE_PROVIDER_SITE_OTHER): Payer: BLUE CROSS/BLUE SHIELD | Admitting: Student in an Organized Health Care Education/Training Program

## 2016-12-09 ENCOUNTER — Encounter: Payer: Self-pay | Admitting: Student in an Organized Health Care Education/Training Program

## 2016-12-09 VITALS — BP 178/110 | HR 85 | Temp 98.9°F | Ht 66.0 in | Wt 189.0 lb

## 2016-12-09 DIAGNOSIS — E010 Iodine-deficiency related diffuse (endemic) goiter: Secondary | ICD-10-CM

## 2016-12-09 DIAGNOSIS — E049 Nontoxic goiter, unspecified: Secondary | ICD-10-CM

## 2016-12-09 DIAGNOSIS — E785 Hyperlipidemia, unspecified: Secondary | ICD-10-CM | POA: Diagnosis not present

## 2016-12-09 DIAGNOSIS — I10 Essential (primary) hypertension: Secondary | ICD-10-CM | POA: Diagnosis not present

## 2016-12-09 MED ORDER — AMLODIPINE BESYLATE 10 MG PO TABS: 10.0000 mg | ORAL_TABLET | Freq: Every day | ORAL | 3 refills | Status: DC

## 2016-12-09 NOTE — Progress Notes (Signed)
   CC: Knee pain  HPI: Angelica Park is a 61 y.o. female with PMH significant for HTN, GERD, osteoarthritis of both knees, HTN, and loss to follow up who [facility] with bilateral [facility] pain, found to be hypertensive and has not been taking medications.   Hypertension - BP noted to be 178/110, continued to be elevated on repeat checks - no chest pain - occasional headaches - no blurry vision - no palpitations - patient previously on amlodipine 10 mg daily however does not remember when she stopped taking this medication. Last noted 05/2015  Left Knee pain - onset 5 months ago - noted to have bilateral degenerative changes L>R on previous XR - has been using topical aspirin at home - exercises only by taking stairs when possible, going door-to-door with church group  HLD - noted on labs from 2016. Statin noted per chart but patient reports she never took this  Review of Symptoms:  See HPI for ROS.   CC, SH/smoking status, and VS noted.  Objective: BP (!) 178/110   Pulse 85   Temp 98.9 F (37.2 C) (Oral)   Ht 5\' 6"  (1.676 m)   Wt 189 lb (85.7 kg)   BMI 30.51 kg/m  GEN: NAD, alert, cooperative, and pleasant. NECK: full ROM, +thyromegaly RESPIRATORY: clear to auscultation bilaterally with no wheezes, rhonchi or rales, good effort CV: RRR, no m/r/g, no peripheral edema NEURO: II-XII grossly intact, normal gait, peripheral sensation intact PSYCH: AAOx3, appropriate affect  Assessment and plan:  Thyromegaly Goiter noted and given HTN, will check TSH, fT4, fT3 at today's visit  Hyperlipidemia Noted 05/2015 - patient asks to recheck lipid panel prior to starting medication - lipid panel ordered for today - can start statin as indicated  HYPERTENSION, BENIGN SYSTEMIC Restart norvasc 10 mg daily - recheck BP as nurse visit in one week - likely will require another agent - follow up in clinic in about 1 month or sooner as needed - return precautions  advised   Orders Placed This Encounter  Procedures  . Basic metabolic panel  . Lipid panel  . TSH  . T3, Free  . T4, Free    Meds ordered this encounter  Medications  . amLODipine (NORVASC) 10 MG tablet    Sig: Take 1 tablet (10 mg total) by mouth daily.    Dispense:  90 tablet    Refill:  3     Howard PouchLauren Chonda Baney, MD,MS,  PGY1 12/09/2016 6:44 PM

## 2016-12-09 NOTE — Assessment & Plan Note (Signed)
Restart norvasc 10 mg daily - recheck BP as nurse visit in one week - likely will require another agent - follow up in clinic in about 1 month or sooner as needed - return precautions advised

## 2016-12-09 NOTE — Patient Instructions (Signed)
It was a pleasure seeing you today in our clinic. Today we discussed blood pressure and knee pain. Here is the treatment plan we have discussed and agreed upon together:  - take 1000 mg tylenol three times daily - take the blood pressure medication, norvasc 10 mg daily  Please schedule nurse follow up for blood pressure check in one week.  Please come see me again in one month (schedule today) or sooner as needed.  Our clinic's number is [phone]. Please call with questions or concerns about what we discussed today.  Be well, Dr. Mosetta PuttFeng

## 2016-12-09 NOTE — Assessment & Plan Note (Signed)
Goiter noted and given HTN, will check TSH, fT4, fT3 at today's visit

## 2016-12-09 NOTE — Assessment & Plan Note (Signed)
Noted 05/2015 - patient asks to recheck lipid panel prior to starting medication - lipid panel ordered for today - can start statin as indicated

## 2016-12-10 LAB — BASIC METABOLIC PANEL
BUN / CREAT RATIO: 12 (ref 12–28)
BUN: 9 mg/dL (ref 8–27)
CO2: 26 mmol/L (ref 20–29)
Calcium: 9.8 mg/dL (ref 8.7–10.3)
Chloride: 106 mmol/L (ref 96–106)
Creatinine, Ser: 0.75 mg/dL (ref 0.57–1.00)
GFR, EST AFRICAN AMERICAN: 99 mL/min/{1.73_m2} (ref >59)
GFR, EST NON AFRICAN AMERICAN: 86 mL/min/{1.73_m2} (ref >59)
Glucose: 88 mg/dL (ref 65–99)
Potassium: 4.2 mmol/L (ref 3.5–5.2)
SODIUM: 143 mmol/L (ref 134–144)

## 2016-12-10 LAB — LIPID PANEL
CHOL/HDL RATIO: 4.1 ratio (ref 0.0–4.4)
Cholesterol, Total: 187 mg/dL (ref 100–199)
HDL: 46 mg/dL (ref >39)
LDL CALC: 117 mg/dL — AB (ref 0–99)
Triglycerides: 119 mg/dL (ref 0–149)
VLDL Cholesterol Cal: 24 mg/dL (ref 5–40)

## 2016-12-10 LAB — T3, FREE: T3 FREE: 2.9 pg/mL (ref 2.0–4.4)

## 2016-12-10 LAB — TSH: TSH: 1.41 u[IU]/mL (ref 0.450–4.500)

## 2016-12-10 LAB — T4, FREE: FREE T4: 0.96 ng/dL (ref 0.82–1.77)

## 2016-12-11 ENCOUNTER — Other Ambulatory Visit: Payer: Self-pay | Admitting: Student in an Organized Health Care Education/Training Program

## 2016-12-11 DIAGNOSIS — I10 Essential (primary) hypertension: Secondary | ICD-10-CM

## 2016-12-11 MED ORDER — AMLODIPINE BESYLATE 10 MG PO TABS: 10.0000 mg | ORAL_TABLET | Freq: Every day | ORAL | 3 refills | Status: AC

## 2016-12-11 MED ORDER — ACETAMINOPHEN 500 MG PO TABS: 1000.0000 mg | ORAL_TABLET | Freq: Three times a day (TID) | ORAL | 0 refills | Status: DC

## 2016-12-11 MED ORDER — ATORVASTATIN CALCIUM 40 MG PO TABS: 40.0000 mg | ORAL_TABLET | Freq: Every day | ORAL | 3 refills | Status: DC

## 2016-12-11 MED ORDER — CALCIUM CITRATE-VITAMIN D 250-100 MG-UNIT PO TABS: 1.0000 | ORAL_TABLET | Freq: Two times a day (BID) | ORAL | 3 refills | Status: DC

## 2016-12-11 NOTE — Telephone Encounter (Signed)
Needs refill on atorvastatin, calicum/vit d and tylenol. [facility] [address]

## 2016-12-12 ENCOUNTER — Telehealth: Payer: Self-pay | Admitting: *Deleted

## 2016-12-12 NOTE — Telephone Encounter (Signed)
Received fax from [facility] needing clarification on the strength of Calcium-Vitamin D 250-100 mg-Unit.  They do not carry that strength. They carry 600-200 mg.  Clovis PuMartin, [place]ue [place] L, RN

## 2016-12-13 ENCOUNTER — Encounter: Payer: Self-pay | Admitting: Student in an Organized Health Care Education/Training Program

## 2016-12-13 ENCOUNTER — Other Ambulatory Visit: Payer: Self-pay | Admitting: Student in an Organized Health Care Education/Training Program

## 2016-12-15 ENCOUNTER — Other Ambulatory Visit: Payer: Self-pay | Admitting: Student in an Organized Health Care Education/Training Program

## 2016-12-16 ENCOUNTER — Ambulatory Visit (INDEPENDENT_AMBULATORY_CARE_PROVIDER_SITE_OTHER): Payer: BLUE CROSS/BLUE SHIELD | Admitting: *Deleted

## 2016-12-16 VITALS — BP 152/90 | HR 86

## 2016-12-16 DIAGNOSIS — Z0130 Encounter for examination of blood pressure without abnormal findings: Secondary | ICD-10-CM

## 2016-12-16 DIAGNOSIS — I10 Essential (primary) hypertension: Secondary | ICD-10-CM

## 2016-12-16 NOTE — Progress Notes (Signed)
   Patient in nurse clinic for blood pressure check.  Pt denies chest pain, SOB, dizziness, headache or visual changes.  Pt stated she take her blood pressure medication everyday at 4 PM.  Will forward to PCP.  Clovis PuMartin, Tamika L, RN  Today's Vitals   12/16/16 1129 12/16/16 1134  BP: (!) 160/90 (!) 152/90  Pulse: 86   SpO2: 97%   PainSc: 0-No pain

## 2016-12-16 NOTE — Telephone Encounter (Signed)
Called patient to discuss elevated blood pressure at today's BP check. Home voice mailbox was full. LVM at cell number. Will attempt to call again at a later time.  Planning to add another antihypertensive agent, likely HCTZ 25 mg QD to her current norvasc 10 mg daily for further blood pressure  Control.

## 2016-12-16 NOTE — Telephone Encounter (Signed)
Patient returns PCP's call.

## 2016-12-17 ENCOUNTER — Other Ambulatory Visit: Payer: Self-pay | Admitting: Student in an Organized Health Care Education/Training Program

## 2016-12-17 MED ORDER — HYDROCHLOROTHIAZIDE 25 MG PO TABS: 25.0000 mg | ORAL_TABLET | Freq: Every day | ORAL | 0 refills | Status: DC

## 2016-12-17 NOTE — Progress Notes (Signed)
Called and spoke with patient regarding elevated BP at blood pressure check. She is agreeable to starting HCTZ 25 mg daily. She has an appointment scheduled with me at end of the month.  Return precautions advised.

## 2017-01-06 ENCOUNTER — Ambulatory Visit (INDEPENDENT_AMBULATORY_CARE_PROVIDER_SITE_OTHER): Payer: BLUE CROSS/BLUE SHIELD | Admitting: Student in an Organized Health Care Education/Training Program

## 2017-01-06 ENCOUNTER — Encounter: Payer: Self-pay | Admitting: Student in an Organized Health Care Education/Training Program

## 2017-01-06 DIAGNOSIS — I10 Essential (primary) hypertension: Secondary | ICD-10-CM

## 2017-01-06 DIAGNOSIS — E785 Hyperlipidemia, unspecified: Secondary | ICD-10-CM | POA: Diagnosis not present

## 2017-01-06 DIAGNOSIS — M170 Bilateral primary osteoarthritis of knee: Secondary | ICD-10-CM

## 2017-01-06 MED ORDER — VITAMIN D 600 IU CAPSULE SWOG S0812: 600.0000 [IU] | ORAL_CAPSULE | Freq: Every day | ORAL | 0 refills | Status: DC

## 2017-01-06 NOTE — Patient Instructions (Addendum)
It was a pleasure seeing you today in our clinic. Today we discussed your blood pressure, cholesterol and knee pain. Here is the treatment plan we have discussed and agreed upon together:  - please start taking your statin medication for cholesterol - your blood pressure is well controlled today! Let's continue your current blood pressure regimen. - You can continue taking tylenol as needed for your knee pain.  Our clinic's number is [phone]. Please call with questions or concerns about what we discussed today.  Be well, Dr. Mosetta PuttFeng

## 2017-01-06 NOTE — Progress Notes (Signed)
   CC: follow up blood pressure  HPI: Angelica Park is a 61 y.o. female with PMH significant for HTN, HLD, knee osteoarthritis who presents [facility] today for follow up.  Hypertension Disease Monitoring: BP in the office  Chest pain, palpitations- denies       Dyspnea- denies Medications: HCTZ 25 mg daily, norvasc 10 mg daily Compliance- 100% reported compliance  Lightheadedness,Syncope- denies    Edema- denies  Hyperlipidemia Disease Monitoring: See symptoms for Hypertension Medications: atorvastatin 40 mg Compliance- has statin at home but has not started taking it yet  Right upper quadrant pain- none   Muscle aches- none  Knee Pain -  XRAY from 07/2014 showed degenerative changes noted bilaterally with osteoarthritic changes in left knee on imaging   Monitoring Labs and Parameters  Last Lipid:     Component Value Date/Time   CHOL 187 12/09/2016 1709   HDL 46 12/09/2016 1709    Last Bmet  Potassium  Date Value Ref Range Status  12/09/2016 4.2 3.5 - 5.2 mmol/L Final   Sodium  Date Value Ref Range Status  12/09/2016 143 134 - 144 mmol/L Final   Creat  Date Value Ref Range Status  05/25/2015 0.72 0.50 - 1.05 mg/dL Final   Creatinine, Ser  Date Value Ref Range Status  12/09/2016 0.75 0.57 - 1.00 mg/dL Final      Last BPs:  BP Readings from Last 3 Encounters:  01/06/17 136/74  12/16/16 (!) 152/90  12/09/16 (!) 178/110    Overdue health maintenance Colonoscopy and mammo forms provided  Review of Symptoms:  See HPI for ROS.   CC, SH/smoking status, and VS noted.  Objective: BP 136/74   Pulse 86   Temp 98.8 F (37.1 C) (Oral)   Ht 5\' 6"  (1.676 m)   Wt 186 lb 3.2 oz (84.5 kg)   SpO2 99%   BMI 30.05 kg/m  GEN: NAD, alert, cooperative, and pleasant. RESPIRATORY: clear to auscultation bilaterally with no wheezes, rhonchi or rales, good effort CV: RRR, no m/r/g, no peripheral edema NEURO: II-XII grossly intact, normal gait, peripheral sensation  intact PSYCH: AAOx3, appropriate affect  Assessment and plan:  Hyperlipidemia - advised to start statin based on ASCVD risk calculator - patient has statin at home - no red flags  Osteoarthritis of both knees - continue 1000 mg tylenol TID - patient reports compliance with this regimen and improvement in symptoms - she is not interested in joint injection or knee replacement surgery  HYPERTENSION, BENIGN SYSTEMIC Controlled, continue current regimen   [name], MD,MS,  PGY2 01/09/2017 9:13 AM

## 2017-01-09 NOTE — Assessment & Plan Note (Signed)
-   continue 1000 mg tylenol TID - patient reports compliance with this regimen and improvement in symptoms - she is not interested in joint injection or knee replacement surgery

## 2017-01-09 NOTE — Assessment & Plan Note (Addendum)
-   advised to start statin based on ASCVD risk calculator - patient has statin at home - no red flags

## 2017-01-09 NOTE — Assessment & Plan Note (Signed)
Controlled, continue current regimen

## 2017-01-21 ENCOUNTER — Telehealth: Payer: Self-pay | Admitting: *Deleted

## 2017-01-21 ENCOUNTER — Other Ambulatory Visit: Payer: Self-pay | Admitting: Student in an Organized Health Care Education/Training Program

## 2017-01-21 DIAGNOSIS — Z1231 Encounter for screening mammogram for malignant neoplasm of breast: Secondary | ICD-10-CM

## 2017-01-21 NOTE — Telephone Encounter (Signed)
Patient left a voice message on nurse line stating she needed [facility]. Please give her a call at [phone].  Clovis PuMartin, Tamika L, RN

## 2017-01-29 ENCOUNTER — Other Ambulatory Visit: Payer: Self-pay | Admitting: Student in an Organized Health Care Education/Training Program

## 2017-01-29 DIAGNOSIS — Z1211 Encounter for screening for malignant neoplasm of colon: Secondary | ICD-10-CM

## 2017-01-29 NOTE — Telephone Encounter (Signed)
Referral placed for colonoscopy. 

## 2017-02-04 ENCOUNTER — Ambulatory Visit: Payer: BLUE CROSS/BLUE SHIELD

## 2017-02-05 ENCOUNTER — Encounter (INDEPENDENT_AMBULATORY_CARE_PROVIDER_SITE_OTHER): Payer: Self-pay

## 2017-02-05 ENCOUNTER — Ambulatory Visit
Admission: RE | Admit: 2017-02-05 | Discharge: 2017-02-05 | Disposition: A | Discharge status: Home / Self Care | Payer: BLUE CROSS/BLUE SHIELD | Source: Ambulatory Visit | Attending: Family Medicine | Admitting: Family Medicine

## 2017-02-05 DIAGNOSIS — Z1231 Encounter for screening mammogram for malignant neoplasm of breast: Secondary | ICD-10-CM

## 2017-02-06 ENCOUNTER — Other Ambulatory Visit: Payer: Self-pay | Admitting: Family Medicine

## 2017-02-06 DIAGNOSIS — R928 Other abnormal and inconclusive findings on diagnostic imaging of breast: Secondary | ICD-10-CM

## 2017-02-16 ENCOUNTER — Other Ambulatory Visit: Payer: Self-pay | Admitting: Family Medicine

## 2017-02-16 ENCOUNTER — Ambulatory Visit
Admission: RE | Admit: 2017-02-16 | Discharge: 2017-02-16 | Disposition: A | Discharge status: Home / Self Care | Payer: BLUE CROSS/BLUE SHIELD | Source: Ambulatory Visit | Attending: Family Medicine | Admitting: Family Medicine

## 2017-02-16 DIAGNOSIS — N6002 Solitary cyst of left breast: Secondary | ICD-10-CM

## 2017-02-16 DIAGNOSIS — R928 Other abnormal and inconclusive findings on diagnostic imaging of breast: Secondary | ICD-10-CM

## 2017-04-21 NOTE — Progress Notes (Signed)
Subjective:    Patient ID: Angelica HughesAnnie M Park , female   DOB: 02/21/1956 , 61 y.o..   MRN: [id number]  HPI  Angelica Park is here for  Chief Complaint  Patient presents with  . Knee Pain    1. Bilateral Knee pain: Patient is here today for bilateral knee pain.  She notes that she has had this pain for at least the last couple years.  She has received x-rays before confirming the diagnosis of arthritis.  She notes that she has tried Tylenol, patches, and Aleve before for pain control.  Currently she is only trying Tylenol and Aspercreme with mild relief.  Patient notes that her knees hurt more when she is ambulating but they actually hurt all the time.  She continues to try and exercise by walking her dogs.  She denies any fevers, chills, history of diabetes, history of gout, blood thinners.   Review of Systems: Per HPI.   Past Medical History: Patient Active Problem List   Diagnosis Date Noted  . Healthcare maintenance 05/27/2015  . Osteoarthritis of both knees 07/15/2014  . Polyuria 05/27/2013  . Obesity (BMI 30-39.9) 12/11/2011  . Atypical ductal hyperplasia of breast 10/06/2011  . Metatarsalgia of both feet 09/02/2011  . Thyromegaly 03/19/2011  . ALLERGIC RHINITIS CAUSE UNSPECIFIED 04/14/2007  . Hyperlipidemia 03/16/2007  . HYPERTENSION, BENIGN SYSTEMIC 08/06/2006  . GASTROESOPHAGEAL REFLUX, NO ESOPHAGITIS 08/06/2006  . SACROILITIS 08/06/2006    Medications: reviewed   Social Hx:  reports that  has never smoked. she has never used smokeless tobacco.   Objective:   BP (!) 164/96   Pulse (!) 59   Temp 98.4 F (36.9 C) (Oral)   Ht 5\' 6"  (1.676 m)   Wt 185 lb 3.2 oz (84 kg)   SpO2 99%   BMI 29.89 kg/m  Physical Exam  Gen: NAD, alert, cooperative with exam, well-appearing, pleasant MSK: Left Knee: Knee appears to have some effusion. Palpation normal with lateral and midline joint line tenderness as well as tenderness to palpation on the patellar tendon.  No warmth    ROM full in flexion and extension and lower leg rotation. Ligaments with solid consistent endpoints including ACL, PCL, LCL, MCL. Non painful patellar compression. Patellar glide with crepitus. Patellar and quadriceps tendons unremarkable. Hamstring and quadriceps strength is normal.   Right Knee:  Knee appears to have some effusion. Palpation normal with lateral and midline joint line tenderness as well as tenderness to palpation on the patellar tendon.  No warmth  ROM full in flexion and extension and lower leg rotation. Ligaments with solid consistent endpoints including ACL, PCL, LCL, MCL. Non painful patellar compression. Patellar glide with crepitus. Patellar and quadriceps tendons unremarkable. Hamstring and quadriceps strength is normal.   Procedure: Left Knee INJECTION:  Patient was given informed consent, signed copy in the chart. Appropriate time out was taken. Area prepped and draped in usual sterile fashion. One cc of methylprednisolone 40 mg/ml plus one cc of 1% lidocaine without epinephrine was injected into the left joint using a(n) perpendicular approach. The patient tolerated the procedure well. There were no complications. Post procedure instructions were given.  Assessment & Plan:  Osteoarthritis of both knees Exam and history most consistent with OA of both knees. Is having knee pain L>R. Has tried Tylenol and Aspercreme which provides some relief but she is still in pain.  - Left knee steroid injection today - Continue Tylenol and Aspercreme PRN - Discussed trying NSAIDs instead of Tylenol (  no Hx of GI bleed or CAD) for more antiinflammatory effect - Return to clinic in 1-2 weeks to inject right knee if she tolerates the left one ok - Can have knee injections every 3 months if she'd like - Discussed if knee injections not providing the relief she needs then we will likely need to refer her to orthopedics  - Of note BP high today (164/96), patient states she did  not take her BP meds. Asked her to take her pressure at home and schedule a visit with PCP if it continues to be above goal  Meds ordered this encounter  Medications  . methylPREDNISolone acetate (DEPO-MEDROL) injection 40 mg    Anders Simmondshristina Gambino, MD [facility], PGY-3

## 2017-04-22 ENCOUNTER — Ambulatory Visit: Payer: BLUE CROSS/BLUE SHIELD | Admitting: Family Medicine

## 2017-04-22 ENCOUNTER — Other Ambulatory Visit: Payer: Self-pay

## 2017-04-22 ENCOUNTER — Encounter: Payer: Self-pay | Admitting: Family Medicine

## 2017-04-22 VITALS — BP 164/96 | HR 59 | Temp 98.4°F | Ht 66.0 in | Wt 185.2 lb

## 2017-04-22 DIAGNOSIS — M25562 Pain in left knee: Secondary | ICD-10-CM | POA: Diagnosis not present

## 2017-04-22 DIAGNOSIS — M170 Bilateral primary osteoarthritis of knee: Secondary | ICD-10-CM | POA: Diagnosis not present

## 2017-04-22 MED ORDER — METHYLPREDNISOLONE ACETATE 40 MG/ML IJ SUSP
40.0000 mg | Freq: Once | INTRAMUSCULAR | Status: AC
  Administered 2017-04-22: 40 mg via INTRAMUSCULAR

## 2017-04-22 NOTE — Assessment & Plan Note (Addendum)
Exam and history most consistent with OA of both knees. Is having knee pain L>R. Has tried Tylenol and Aspercreme which provides some relief but she is still in pain.  - Left knee steroid injection today - Continue Tylenol and Aspercreme PRN - Discussed trying NSAIDs instead of Tylenol (no Hx of GI bleed or CAD) for more antiinflammatory effect - Return to clinic in 1-2 weeks to inject right knee if she tolerates the left one ok - Can have knee injections every 3 months if she'd like - Discussed if knee injections not providing the relief she needs then we will likely need to refer her to orthopedics  - Of note BP high today (164/96), patient states she did not take her BP meds. Asked her to take her pressure at home and schedule a visit with PCP if it continues to be above goal

## 2017-04-22 NOTE — Patient Instructions (Addendum)
Thank you for coming in today, it was so nice to see you! Today we talked about:    Knee pain: You received a knee injection today of your left knee.  Continue using Tylenol and Aspercreme as needed.  If you can try using ibuprofen as this will provide a little better relief than Tylenol typically.  If you find relieved with your left knee injection today can come back in the next 1-2 weeks and have your right knee injected  You can have your knees injected every 3 months as needed  Continue to stay active and walk   When there comes a point when the injections are no longer helping we may need to send you to an orthopedic specialist to discuss further options  Please follow up as needed.   If you have any questions or concerns, please do not hesitate to call the office at [phone]. You can also message me directly via MyChart.   Sincerely,  Anders Simmondshristina Gambino, MD

## 2017-05-20 ENCOUNTER — Ambulatory Visit: Payer: BLUE CROSS/BLUE SHIELD | Admitting: Student in an Organized Health Care Education/Training Program

## 2017-05-28 ENCOUNTER — Other Ambulatory Visit: Payer: Self-pay

## 2017-05-28 ENCOUNTER — Ambulatory Visit: Payer: BLUE CROSS/BLUE SHIELD | Admitting: Student in an Organized Health Care Education/Training Program

## 2017-05-28 ENCOUNTER — Encounter: Payer: Self-pay | Admitting: Student in an Organized Health Care Education/Training Program

## 2017-05-28 VITALS — BP 148/70 | HR 78 | Temp 98.1°F | Ht 66.0 in | Wt 187.8 lb

## 2017-05-28 DIAGNOSIS — M1711 Unilateral primary osteoarthritis, right knee: Secondary | ICD-10-CM | POA: Diagnosis not present

## 2017-05-28 DIAGNOSIS — E010 Iodine-deficiency related diffuse (endemic) goiter: Secondary | ICD-10-CM | POA: Diagnosis not present

## 2017-05-28 DIAGNOSIS — N6320 Unspecified lump in the left breast, unspecified quadrant: Secondary | ICD-10-CM

## 2017-05-28 MED ORDER — METHYLPREDNISOLONE ACETATE 40 MG/ML IJ SUSP
40.0000 mg | Freq: Once | INTRAMUSCULAR | Status: AC
  Administered 2017-05-28: 40 mg via INTRAMUSCULAR

## 2017-05-28 NOTE — Assessment & Plan Note (Signed)
-   no red flags for joint infection - 40 mg solumedrol R knee joint injection today in the office - continue ibupron PRN pain - encourage continued movement at the joint - return precautions provided - follow up as needed

## 2017-05-28 NOTE — Assessment & Plan Note (Signed)
Noted on 02/2017 ultrasound. Recommendation was repeat L breast ultrasound 6 months later - patient reports this has been scheduled for 08/2017 - will follow up results

## 2017-05-28 NOTE — Progress Notes (Signed)
CC: R knee pain  HPI: Angelica Park is a 61 y.o. female with PMH significant for bilateral osteoarthritis s/p L knee steroid injection, goiter who [facility] with R knee pain of several months duration.   L Knee Pain Several months duration. Patient has osteoarthritis of both knees. She her right knee injection done at her previous visit and tolerated this well. She said that she's had improvement in her pain in the left knee after the injection. She occasionally takes ibuprofen. She has not had any fevers. She denies noticing any redness of the joint. She would like a joint injection of the left knee today  Goiter Patient reports that she feels her thyroid is getting larger. She does have a history of goiter with thyroid nodules. This was previously worked up in 2012. She has had a needle aspiration done in 2012 which was inconclusive. Her last ultrasound was done in 2013. She had normal TSH, T3, T4 done in July of this year. She states she's noticed some necklaces seem tighter. She does not have any other doctors following this. Previously was followed by surgery.  Breast Mass Patient has a left breast mass at 2:00 3 cm from the nipple noted on ultrasound 02/2017. Recommendation was that follow-up ultrasound be done 6 months later in March 2019.  Review of Symptoms:  See HPI for ROS.   CC, SH/smoking status, and VS noted.  Objective: BP (!) 148/70   Pulse 78   Temp 98.1 F (36.7 C) (Oral)   Ht 5\' 6"  (1.676 m)   Wt 187 lb 12.8 oz (85.2 kg)   SpO2 99%   BMI 30.31 kg/m  GEN: NAD, alert, cooperative, and pleasant. NECK: +thyromegaly, nontender, no palpable nodule CARD: Heart sounds regular Knee: Normal to inspection with no erythema or effusion or obvious bony abnormalities. Palpation normal with no warmth, +mild joint line tenderness, no patellar tenderness, no condyle tenderness. ROM full in flexion and extension and lower leg rotation. Non painful patellar  compression. Patellar glide without crepitus. Patellar and quadriceps tendons unremarkable. Hamstring and quadriceps strength is normal.   Right Knee Injection Patient was given informed consent, signed copy in the chart. Appropriate time out was taken. Area prepped and draped in usual sterile fashion. 4 cc of methylprednisolone 40 mg/ml plus  1 cc of 1% lidocaine without epinephrine was injected into the Right knee using a(n) anterolateral approach. The patient tolerated the procedure well. There were no complications. Post procedure instructions were given.  Assessment and plan:  Thyromegaly +goiter with history of nodules. TSH, T3, T4 WNL in 12/2016. Previous ultrasound done in 2013. FNA completed in 2012, nondiagnostic. Multiple thyroid nodules - plan to repeat ultrasound today to check for enlargement of nodules - consider sending for FNA if thyroid nodules enlarging - precepted w Dr. Deirdre Priesthambliss  Osteoarthritis of right knee - no red flags for joint infection - 40 mg solumedrol R knee joint injection today in the office - continue ibupron PRN pain - encourage continued movement at the joint - return precautions provided - follow up as needed  Left breast mass Noted on 02/2017 ultrasound. Recommendation was repeat L breast ultrasound 6 months later - patient reports this has been scheduled for 08/2017 - will follow up results   Orders Placed This Encounter  Procedures  . US Soft Tissue Head/Neck    Standing Status:   Future    Standing Expiration Date:   07/29/2018    Order Specific Question:  Reason for Exam (SYMPTOM  OR DIAGNOSIS REQUIRED)    Answer:   thyroid nodules    Order Specific Question:   Preferred imaging location?    Answer[facility]     No orders of the defined types were placed in this encounter.   Howard PouchLauren Aidel Davisson, MD,MS,  PGY2 05/28/2017 2:10 PM

## 2017-05-28 NOTE — Addendum Note (Signed)
Addended by: Georges LynchSAUNDERS, Kanav Kazmierczak T on: 05/28/2017 04:06 PM   Modules accepted: Orders

## 2017-05-28 NOTE — Patient Instructions (Addendum)
It was a pleasure seeing you today in our clinic. Today we discussed your knee pain. Here is the treatment plan we have discussed and agreed upon together: It was a pleasure seeing you today in our clinic. Today we discussed your knee pain and did an injection. Here is the treatment plan we have discussed and agreed upon together:  Right knee - You may be sore over the next day, however please continue to move around so the joint does not get stiff. - Use ibuprofen as needed for pain at home - If you notice an area of redness with warmth, swelling, pain, or you develop fevers, these would be reasons to call our office back or come in to be seen.  Thyroid We will check an ultrasound to see if your thyroid nodules have grown.  Our clinic's number is [phone]. Please call with questions or concerns about what we discussed today.  Be well, Dr. Mosetta PuttFeng

## 2017-05-28 NOTE — Assessment & Plan Note (Addendum)
+  goiter with history of nodules. TSH, T3, T4 WNL in 12/2016. Previous ultrasound done in 2013. FNA completed in 2012, nondiagnostic. Multiple thyroid nodules - plan to repeat ultrasound today to check for enlargement of nodules - consider sending for FNA if thyroid nodules enlarging - precepted w Dr. Deirdre Priesthambliss

## 2017-05-29 ENCOUNTER — Ambulatory Visit
Admission: RE | Admit: 2017-05-29 | Discharge: 2017-05-29 | Disposition: A | Discharge status: Home / Self Care | Payer: BLUE CROSS/BLUE SHIELD | Source: Ambulatory Visit | Attending: Family Medicine | Admitting: Family Medicine

## 2017-05-29 ENCOUNTER — Other Ambulatory Visit: Payer: Self-pay | Admitting: Student in an Organized Health Care Education/Training Program

## 2017-05-29 DIAGNOSIS — E010 Iodine-deficiency related diffuse (endemic) goiter: Secondary | ICD-10-CM

## 2017-06-19 ENCOUNTER — Telehealth: Payer: Self-pay

## 2017-06-19 NOTE — Telephone Encounter (Signed)
Pt has an appt on 1/15 with Dr. Mosetta PuttFeng. Sunday SpillersSharon T Saunders, CMA

## 2017-06-23 ENCOUNTER — Ambulatory Visit: Payer: BLUE CROSS/BLUE SHIELD | Admitting: Student in an Organized Health Care Education/Training Program

## 2017-06-23 ENCOUNTER — Encounter: Payer: Self-pay | Admitting: Student in an Organized Health Care Education/Training Program

## 2017-06-23 ENCOUNTER — Other Ambulatory Visit: Payer: Self-pay

## 2017-06-23 VITALS — BP 122/86 | HR 76 | Temp 98.5°F | Ht 66.0 in | Wt 188.0 lb

## 2017-06-23 DIAGNOSIS — E041 Nontoxic single thyroid nodule: Secondary | ICD-10-CM

## 2017-06-23 DIAGNOSIS — E010 Iodine-deficiency related diffuse (endemic) goiter: Secondary | ICD-10-CM

## 2017-06-23 NOTE — Progress Notes (Signed)
   CC: Goiter  HPI: Angelica Park is a 62 y.o. female with PMH significant for thyroid nodule and goiter who [facility] follow-up of her thyroid ultrasound was previously completed.   Patient was evaluated in 2012 for goiter. She was sent for thyroid ultrasound at that time and noted to have multiple thyroid nodules. They were biopsied with follicular cells but not enough cells for diagnosis.   Patient previously noted that her thyroid was getting larger. She brought it up at her previous visit and repeat ultrasound was ordered. This was noted to be Ti-rads 3, which appears to be a grade of "mildly suspicious" for thyroid nodules with recommendation for biopsy if the patient is agreeable based on the ultrasound report.  Patient has not been having chest pain, palpitations, shortness of breath, nausea, vomiting, diarrhea, or constipation.  Review of Symptoms:  See HPI for ROS.   CC, SH/smoking status, and VS noted.  Objective: BP 122/86   Pulse 76   Temp 98.5 F (36.9 C) (Oral)   Ht 5\' 6"  (1.676 m)   Wt 188 lb (85.3 kg)   SpO2 99%   BMI 30.34 kg/m  GEN: NAD, alert, cooperative, and pleasant. NECK: full ROM, +nontender thyromegaly RESPIRATORY: clear to auscultation bilaterally with no wheezes, rhonchi or rales, good effort CV: RRR, no m/r/g, no peripheral edema GI: soft, non-tender, non-distended, no hepatosplenomegaly SKIN: warm and dry, no rashes or lesions NEURO: II-XII grossly intact PSYCH: AAOx3, appropriate affect  Thyroid ultrasound 12/21 IMPRESSION: 1. The previously sampled left inferior thyroid nodule has changed in morphology with development of new shadowing internal calcifications centrally. The nodule may be larger but is difficult to measure due to ill-defined margins. Due to change in risk categorization, repeat fine-needle aspiration of this nodule should be considered. If biopsy is not performed, recommend follow-up ultrasound in 1 year. 2.  The rest of the thyroid gland is heterogeneous without a discrete measurable nodule identified by current ultrasound. The right lobe is slightly larger in dimensions compared to prior studies. The left lobe is also larger.  Assessment and plan:  Thyromegaly Discussed "mildly suspicious" thyroid nodules on ultrasound with the patient who is interested in undergoing FNA to have these evaluated. Patient is interested in speaking further with endocrinology about this problem which seems reasonable. - Placed referral to Endocrinology   Orders Placed This Encounter  Procedures  . Ambulatory referral to Endocrinology    Referral Priority:   Routine    Referral Type:   Consultation    Referral Reason:   Specialty Services Required    Number of Visits Requested:   1    No orders of the defined types were placed in this encounter.    Howard PouchLauren Bluma Buresh, MD,MS,  PGY2 06/29/2017 2:31 AM

## 2017-06-23 NOTE — Patient Instructions (Signed)
It was a pleasure seeing you today in our clinic. Today we discussed your thyroid ultrasound. Here is the treatment plan we have discussed and agreed upon together:   The nodules on your thyroid ultrasound . Larger than the last time checked. It may be a good idea for you to have a biopsy.   A consult was placed to endocrinology at today's visit.  You will receive a call to schedule an appointment. If you do not receive a call within two weeks please call our office so we can place the consult again.  Our clinic's number is [phone]. Please call with questions or concerns about what we discussed today.  Be well, Dr. Mosetta PuttFeng

## 2017-06-29 ENCOUNTER — Encounter: Payer: Self-pay | Admitting: Student in an Organized Health Care Education/Training Program

## 2017-06-29 NOTE — Assessment & Plan Note (Addendum)
Discussed "mildly suspicious" thyroid nodules on ultrasound with the patient who is interested in undergoing FNA to have these evaluated. Patient is interested in speaking further with endocrinology about this problem which seems reasonable. - Placed referral to Endocrinology

## 2017-08-10 ENCOUNTER — Encounter: Payer: Self-pay | Admitting: Endocrinology

## 2017-08-10 ENCOUNTER — Ambulatory Visit: Payer: BLUE CROSS/BLUE SHIELD | Admitting: Endocrinology

## 2017-08-10 VITALS — BP 138/82 | HR 58 | Temp 98.6°F | Ht 66.0 in | Wt 189.0 lb

## 2017-08-10 DIAGNOSIS — E010 Iodine-deficiency related diffuse (endemic) goiter: Secondary | ICD-10-CM

## 2017-08-10 NOTE — Progress Notes (Signed)
Subjective:    Patient ID: Angelica Park, female    DOB: 06/18/1955, 62 y.o.   MRN: [id number]  HPI Pt is referred by Dr Mosetta PuttFeng, for multinodular thyroid.  Pt was noted to have a goiter in 2012.  she has no h/o XRT or surgery to the neck.  She has slight fullness at the ant neck, but no assoc pain.  Pt also says she gets a "choking" sensation when she flexes her neck Past Medical History:  Diagnosis Date  . Atypical ductal hyperplasia of breast 08/2010   right  . GERD (gastroesophageal reflux disease)   . Hemorrhoid   . History of DVT (deep vein thrombosis)    Had PE on OCP's  . Hypertension     Past Surgical History:  Procedure Laterality Date  . ABDOMINAL HYSTERECTOMY  1997   fibroids  . BIOPSY THYROID    . BREAST BIOPSY Right   . BREAST SURGERY  4/12   removal of lumps in breast     Social History   Socioeconomic History  . Marital status: Widowed    Spouse name: Not on file  . Number of children: Not on file  . Years of education: GED  . Highest education level: Not on file  Social Needs  . Financial resource strain: Not on file  . Food insecurity - worry: Not on file  . Food insecurity - inability: Not on file  . Transportation needs - medical: Not on file  . Transportation needs - non-medical: Not on file  Occupational History  . Occupation: CNA  Tobacco Use  . Smoking status: Never Smoker  . Smokeless tobacco: Never Used  Substance and Sexual Activity  . Alcohol use: Yes    Comment: Occasional. CAGE negative.  . Drug use: No  . Sexual activity: Yes  Other Topics Concern  . 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 amLODipine (NORVASC) 10  MG tablet Take 1 tablet (10 mg total) by mouth daily. 90 tablet 3  . cholecalciferol (VITAMIN D) 400 units TABS tablet Take 400 Units by mouth daily.    Marland Kitchen. glucosamine-chondroitin 500-400 MG tablet Take 1 tablet by mouth daily.    . hydrochlorothiazide (HYDRODIURIL) 25 MG tablet TAKE 1 TABLET BY MOUTH ONCE DAILY 90 tablet 0  . Multiple Vitamin (MULTIVITAMIN) LIQD Take 5 mLs by mouth daily.    . Probiotic Product (PROBIOTIC ACIDOPHILUS BEADS PO) Take by mouth.    . vitamin C (ASCORBIC ACID) 500 MG tablet Take 500 mg by mouth daily.    . vitamin E 100 UNIT capsule Take 100 Units by mouth daily.     No current facility-administered medications on file prior to visit.     Allergies  Allergen Reactions  . Codeine     Family History  Problem Relation Age of Onset  . Diabetes Mother   . Stroke Mother   . Hypertension Mother   . Goiter Mother   . Cancer Father        lung  . Diabetes Sister   . Cancer Sister        throat ca  . Hyperlipidemia Sister   .  Hypertension Sister   . Goiter Sister   . Cancer Brother        prostate  . Diabetes Brother   . Hypertension Brother   . Goiter Brother   . Cancer Other        niece; breast ca 68yrs  . Kidney disease Other        nieces  . Breast cancer Neg Hx     BP 138/82 (BP Location: Right Arm, Patient Position: Sitting, Cuff Size: Normal)   Pulse (!) 58   Temp 98.6 F (37 C) (Oral)   Ht 5\' 6"  (1.676 m)   Wt 189 lb (85.7 kg)   SpO2 99%   BMI 30.51 kg/m     Review of Systems Denies weight change, hoarseness, visual loss, chest pain, sob, dysphagia, diarrhea, itching, flushing, easy bruising, depression, cold intolerance, headache, and numbness.  She has intermitt cough and rhinorrhea.      Objective:   Physical Exam VS: see vs page GEN: no distress HEAD: head: no deformity eyes: no periorbital swelling, no proptosis external nose and ears are normal mouth: no lesion seen NECK: supple, thyroid is not enlarged CHEST WALL:  no deformity LUNGS: clear to auscultation CV: reg rate and rhythm, no murmur ABD: abdomen is soft, nontender.  no hepatosplenomegaly.  not distended.  no hernia MUSCULOSKELETAL: muscle bulk and strength are grossly normal.  no obvious joint swelling.  gait is normal and steady EXTEMITIES: no deformity.  no edema PULSES: no carotid bruit NEURO:  cn 2-12 grossly intact.   readily moves all 4's.  sensation is intact to touch on all 4's SKIN:  Normal texture and temperature.  No rash or suspicious lesion is visible.   NODES:  None palpable at the neck PSYCH: alert, well-oriented.  Does not appear anxious nor depressed.    Lab Results  Component Value Date   TSH 1.410 12/09/2016    [place]: The previously sampled left inferior thyroid nodule has changed in morphology with development of new shadowing internal calcifications centrally. The nodule may be larger but is difficult to measure due to ill-defined margins. Due to change in risk categorization, repeat fine-needle aspiration of this nodule should be considered. If biopsy is not performed, recommend follow-up ultrasound in 1 year.  The rest of the thyroid gland is heterogeneous without a discrete measurable nodule identified by current ultrasound. The right lobe is slightly larger in dimensions compared to prior studies. The left lobe is also larger  bx (2012): cystic fluid   I have reviewed outside records, and summarized: Pt was noted to have thyroid nodule, and referred here.  Pt felt the goiter was enlarging, so f/u US was ordered.      Assessment & Plan:  Thyroid nodule, partially cystic, new to me, uncertain etiology  Patient Instructions  Let's recheck the biopsy, guided by ultrasound.  you will receive a phone call, about a day and time for an appointment. If as expected, no cancer is found, please come back for a follow-up appointment in 1 year.

## 2017-08-10 NOTE — Patient Instructions (Addendum)
Let's recheck the biopsy, guided by ultrasound.  you will receive a phone call, about a day and time for an appointment. If as expected, no cancer is found, please come back for a follow-up appointment in 1 year.

## 2017-08-18 ENCOUNTER — Ambulatory Visit
Admission: RE | Admit: 2017-08-18 | Discharge: 2017-08-18 | Disposition: A | Discharge status: Home / Self Care | Payer: BLUE CROSS/BLUE SHIELD | Source: Ambulatory Visit | Attending: Family Medicine | Admitting: Family Medicine

## 2017-08-18 ENCOUNTER — Other Ambulatory Visit: Payer: Self-pay | Admitting: Family Medicine

## 2017-08-18 DIAGNOSIS — N6002 Solitary cyst of left breast: Secondary | ICD-10-CM

## 2017-08-18 DIAGNOSIS — N63 Unspecified lump in unspecified breast: Secondary | ICD-10-CM

## 2017-08-19 ENCOUNTER — Other Ambulatory Visit (HOSPITAL_COMMUNITY)
Admission: RE | Admit: 2017-08-19 | Discharge: 2017-08-19 | Disposition: A | Discharge status: Home / Self Care | Payer: BLUE CROSS/BLUE SHIELD | Source: Ambulatory Visit | Attending: Radiology | Admitting: Radiology

## 2017-08-19 ENCOUNTER — Ambulatory Visit
Admission: RE | Admit: 2017-08-19 | Discharge: 2017-08-19 | Disposition: A | Discharge status: Home / Self Care | Payer: BLUE CROSS/BLUE SHIELD | Source: Ambulatory Visit | Attending: Endocrinology | Admitting: Endocrinology

## 2017-08-19 DIAGNOSIS — E010 Iodine-deficiency related diffuse (endemic) goiter: Secondary | ICD-10-CM | POA: Diagnosis present

## 2017-09-06 ENCOUNTER — Emergency Department (HOSPITAL_COMMUNITY)
Admission: EM | Admit: 2017-09-06 | Discharge: 2017-09-07 | Disposition: E | Payer: BLUE CROSS/BLUE SHIELD | Attending: Emergency Medicine | Admitting: Emergency Medicine

## 2017-09-06 DIAGNOSIS — Z79899 Other long term (current) drug therapy: Secondary | ICD-10-CM | POA: Diagnosis not present

## 2017-09-06 DIAGNOSIS — I469 Cardiac arrest, cause unspecified: Secondary | ICD-10-CM | POA: Insufficient documentation

## 2017-09-06 DIAGNOSIS — I10 Essential (primary) hypertension: Secondary | ICD-10-CM | POA: Insufficient documentation

## 2017-09-07 DIAGNOSIS — 419620001 Death: Secondary | SNOMED CT

## 2017-09-07 NOTE — Progress Notes (Addendum)
Responded to this post CPR for this patient and family.  Patient died.  Many family members in the consult room.  One family member, Silvio PateShelia, passed out.  Nurse has provided assistance to her as she is having very difficult time with this death.  Chaplain provided hospitality and accompanied MD to talk with family.  Accompanied family members back to see their loved one.  Emotional and grief support provided.  Chaplain will remain available as needed.    12-Feb-2018 1510  Clinical Encounter Type  Visited With Patient and family together;Health care provider  Visit Type Initial;Spiritual support;ED;Death;Trauma  Spiritual Encounters  Spiritual Needs Emotional;Grief support   Chaplain checked back with family.  Gave Faith patient's belongings and placement card.  Next of kin information received and given to nurse.

## 2017-09-07 NOTE — ED Notes (Signed)
Chaplain notified 

## 2017-09-07 NOTE — ED Triage Notes (Signed)
[facility] for [facility]. Patient was at home with family when she went into the bathroom. After 15 minutes, family went to check on patient and found her unresponsive on floor. Called 911 at 1348. Patient found to be asystole by EMS, received 7 epinephrine injections from EMS, continued to be asystole throughout transport and arrival to ED.

## 2017-09-07 NOTE — ED Provider Notes (Signed)
[facility] EMERGENCY [facility]PARTMENT Provider Note   CSN: [id number] Arrival date & time:    5 caveat acuity of situation history is obtained from paramedics and from family members accompany patient    History   Chief Complaint Chief Complaint  Patient presents with  . Cardiac Arrest    HPI Angelica Park is a 62 y.o. female.  Patient was eating at a restaurant she went to the bathroom for prolonged period of time family members went to the bathroom to check on her and found her unresponsive.  EMS was called arrived at 1:48 PM to find patient unresponsive rhythm asystole.  EMS placed patient on [place] device, started CPR established intraosseous access.  Rhythm converted to ventricular fibrillation.  She was defibrillated twice.  Rhythm deteriorated to asystole.  She received epinephrine 7 mg by intraosseous line without change in rhythm.  She arrived here unresponsive rhythm asystole  HPI  Past Medical History:  Diagnosis Date  . Atypical ductal hyperplasia of breast 08/2010   right  . GERD (gastroesophageal reflux disease)   . Hemorrhoid   . History of DVT (deep vein thrombosis)    Had PE on OCP's  . Hypertension     Patient Active Problem List   Diagnosis Date Noted  . Left breast mass 05/28/2017  . Healthcare maintenance 05/27/2015  . Osteoarthritis of right knee 07/15/2014  . Polyuria 05/27/2013  . Obesity (BMI 30-39.9) 12/11/2011  . Atypical ductal hyperplasia of breast 10/06/2011  . Metatarsalgia of both feet 09/02/2011  . Thyromegaly 03/19/2011  . ALLERGIC RHINITIS CAUSE UNSPECIFIED 04/14/2007  . Hyperlipidemia 03/16/2007  . HYPERTENSION, BENIGN SYSTEMIC 08/06/2006  . GASTROESOPHAGEAL REFLUX, NO ESOPHAGITIS 08/06/2006  . SACROILITIS 08/06/2006    Past Surgical History:  Procedure Laterality Date  . ABDOMINAL HYSTERECTOMY  1997   fibroids  . BIOPSY THYROID    . BREAST BIOPSY Right   . BREAST SURGERY  4/12   removal of lumps in breast       OB History   None      Home Medications    Prior to Admission medications   Medication Sig Start Date End Date Taking? Authorizing Provider  amLODipine (NORVASC) 10 MG tablet Take 1 tablet (10 mg total) by mouth daily. 12/11/16   Howard PouchFeng, Lauren, MD  cholecalciferol (VITAMIN D) 400 units TABS tablet Take 400 Units by mouth daily.    [provider]  glucosamine-chondroitin 500-400 MG tablet Take 1 tablet by mouth daily.    [provider]  hydrochlorothiazide (HYDRODIURIL) 25 MG tablet TAKE 1 TABLET BY MOUTH ONCE DAILY 01/26/17   Howard PouchFeng, Lauren, MD  Multiple Vitamin (MULTIVITAMIN) LIQD Take 5 mLs by mouth daily.    [provider]  Probiotic Product (PROBIOTIC ACIDOPHILUS BEADS PO) Take by mouth.    [provider]  vitamin C (ASCORBIC ACID) 500 MG tablet Take 500 mg by mouth daily.    [provider]  vitamin E 100 UNIT capsule Take 100 Units by mouth daily.    [provider]    Family History Family History  Problem Relation Age of Onset  . Diabetes Mother   . Stroke Mother   . Hypertension Mother   . Goiter Mother   . Cancer Father        lung  . Diabetes Sister   . Cancer Sister        throat ca  . Hyperlipidemia Sister   . Hypertension Sister   . Goiter Sister   .  Cancer Brother        prostate  . Diabetes Brother   . Hypertension Brother   . Goiter Brother   . Cancer Other        niece; breast ca 66yrs  . Kidney disease Other        nieces  . Breast cancer Neg Hx     Social History Social History   Tobacco Use  . Smoking status: Never Smoker  . Smokeless tobacco: Never Used  Substance Use Topics  . Alcohol use: Yes    Comment: Occasional. CAGE negative.  . Drug use: No     Allergies   Codeine   Review of Systems Review of Systems  Unable to perform ROS: Acuity of condition     Physical Exam Updated Vital Signs BP (!) 0/0 (BP Location: Right Arm)   Pulse (!) 0   Temp (!) 95.5 F (35.3  C) (Temporal)   Physical Exam  Constitutional:  Responsive Glasgow Coma Score 3  HENT:  Head: Normocephalic and atraumatic.  Orally intubated  Eyes:  Pupils fixed and dilated  Neck:  No signs of trauma  Cardiovascular:  Heart sounds absent  Pulmonary/Chest:  No spontaneous respiration  Abdominal:  Obese.  No signs of trauma  Musculoskeletal:  No signs of trauma no deformity.  Peripheral pulses absent  Neurological:  Unresponsive Glasgow Coma Score 3  Skin: Skin is dry. No rash noted.  cool  Nursing note and vitals reviewed.  Pulse 0 spontaneous respirations 0 heart rate 0  ED Treatments / Results  Labs (all labs ordered are listed, but only abnormal results are displayed) Labs Reviewed - No data to display  EKG None  Radiology No results found.  Procedures Procedures (including critical care time)  Medications Ordered in ED Medications - No data to display   Initial Impression / Assessment and Plan / ED Course  I have reviewed the triage vital signs and the nursing notes.  Pertinent labs & imaging results that were available during my care of the patient were reviewed by me and considered in my medical decision making (see chart for details).     Placed on cardiac monitor rhythm showed asystole patient pronounced dead by me at 2:29 PM and family notified of patient's death I spoke with Dr. Chanetta Marshall.  Dr.Lauren Mosetta Putt from family medicine service will sign death certificate Final Clinical Impressions(s) / ED Diagnoses  Diagnosis dead on arrival Final diagnoses:  None    ED Discharge Orders    None       Doug Sou, MD 09/20/17 [phone]

## 2017-09-07 DEATH — deceased

## 2017-09-11 ENCOUNTER — Telehealth: Payer: Self-pay | Admitting: Student in an Organized Health Care Education/Training Program

## 2017-09-11 NOTE — Telephone Encounter (Signed)
Death Certificate was dropped off by funeral home and has been placed in PCP's box for completion. Please use blue or black ink. Please return to [facility] once completed and signed.

## 2017-09-13 NOTE — Telephone Encounter (Signed)
Hi Lynette,  I was unsure how to get you this death certificate. It is completed and [facility]. I am post call on 4/8, however if you would like you can take the completed certificate from my box.

## 2017-09-14 NOTE — Telephone Encounter (Signed)
Death certificate given to Gennette PacLynette Sells.   Jazmin Hartsell,CMA

## 2018-02-22 ENCOUNTER — Other Ambulatory Visit: Payer: BLUE CROSS/BLUE SHIELD

## 2018-08-16 ENCOUNTER — Ambulatory Visit: Payer: BLUE CROSS/BLUE SHIELD | Admitting: Endocrinology

## 2019-11-14 IMAGING — US US THYROID
1 series · 12 of 25 positions shown · non-contrast
Comparison: 07/07/2011 and 03/27/2011

CLINICAL DATA: Palpable abnormality. Enlarged thyroid gland on
physical examination and history of thyroid nodules with prior
fine-needle aspiration of isthmus and left thyroid nodules on
04/21/2011 demonstrating scan cellularity and no evidence of
malignancy.

EXAM:
THYROID ULTRASOUND
TECHNIQUE: Ultrasound examination of the thyroid gland and adjacent soft
tissues was performed.

[Series 1: us thyroid · 0.08mm/px · 58 acquisitions, 12 frames shown]
[im 3/58]
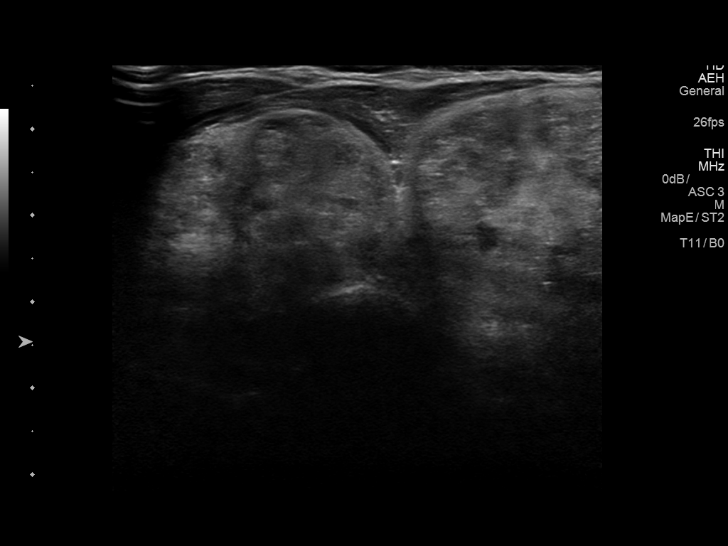
[im 8/58]
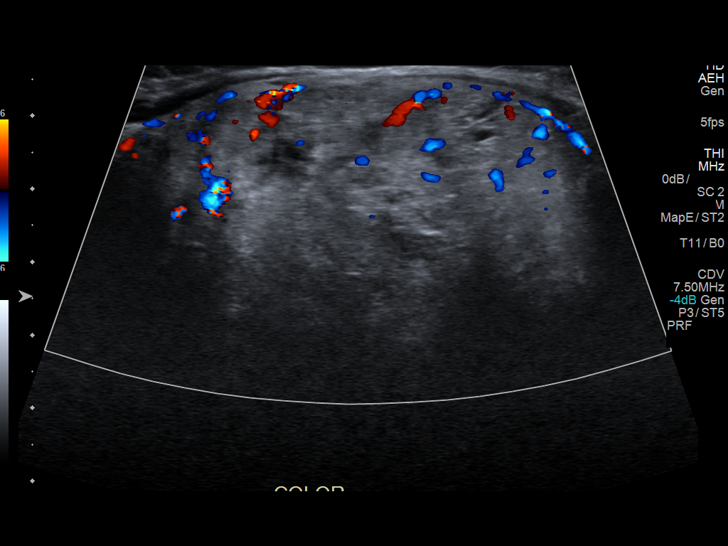
[im 12/58]
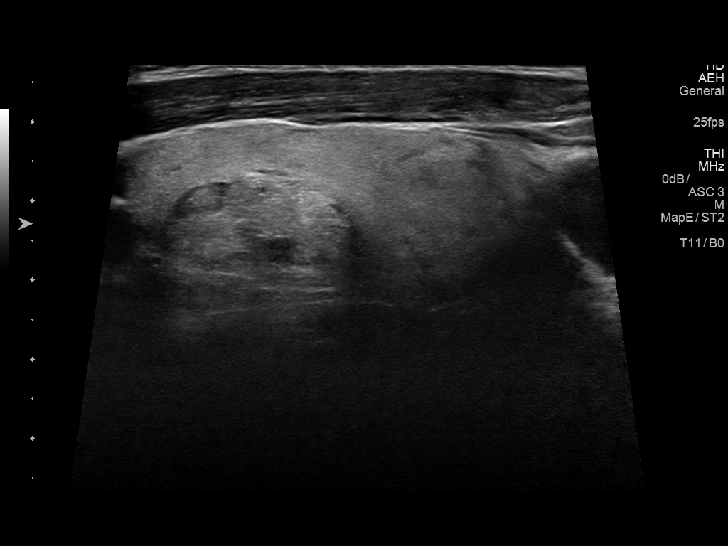
[im 17/58]
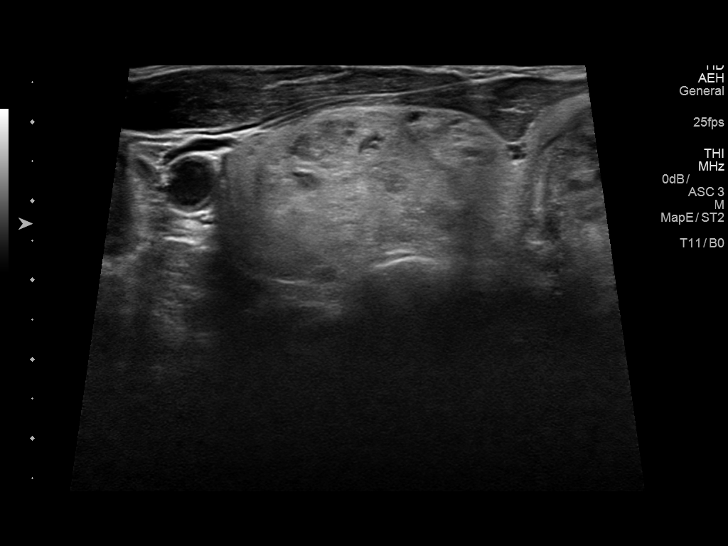
[im 22/58]
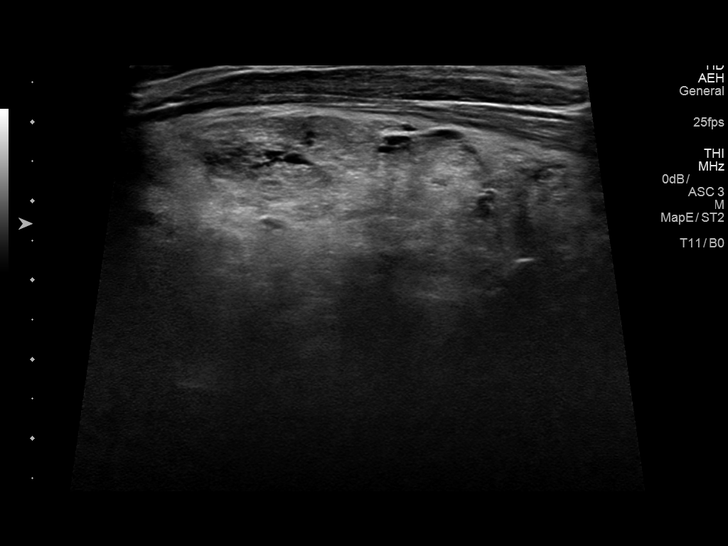
[im 27/58]
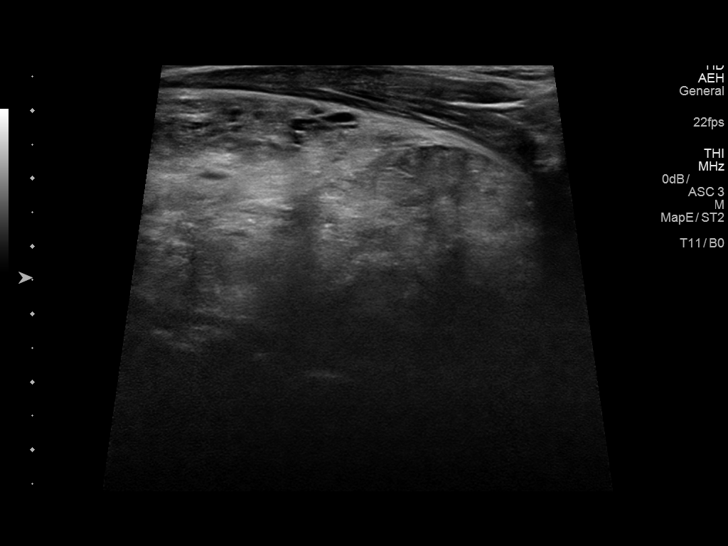
[im 31/58]
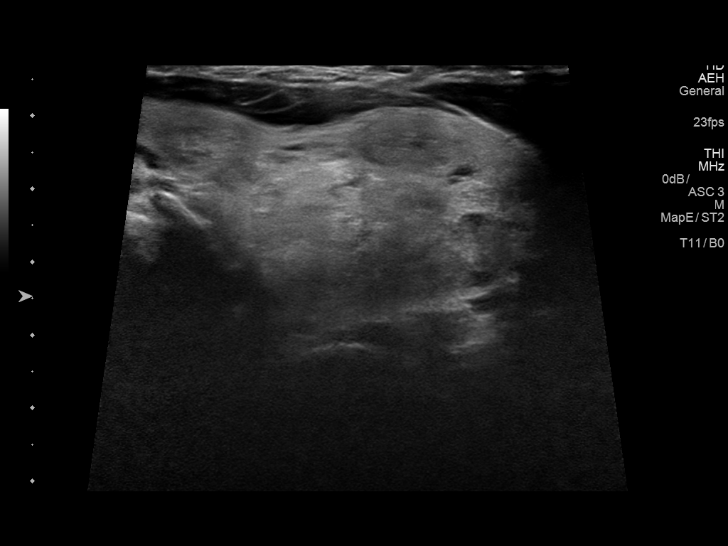
[im 36/58]
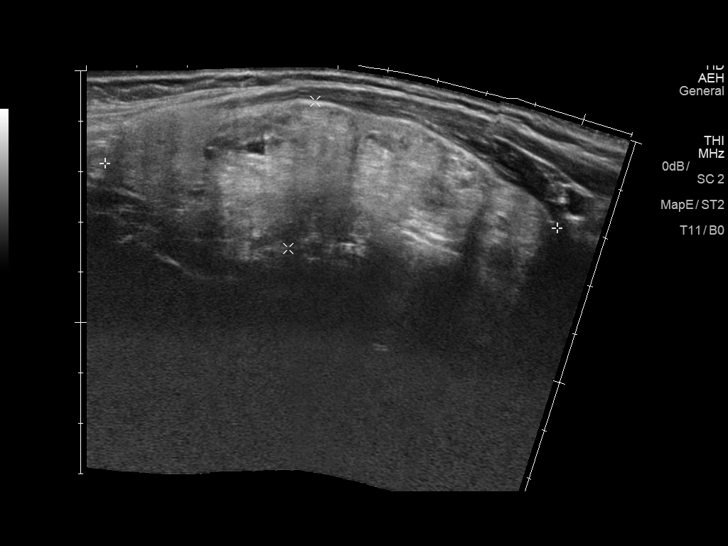
[im 41/58]
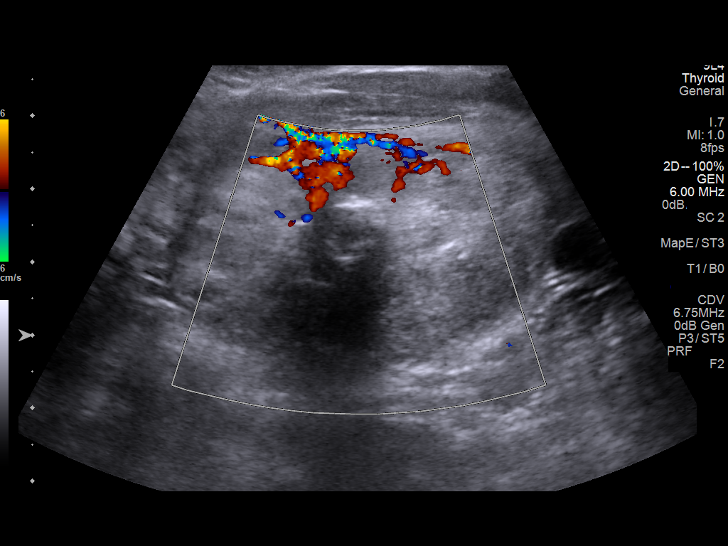
[im 46/58]
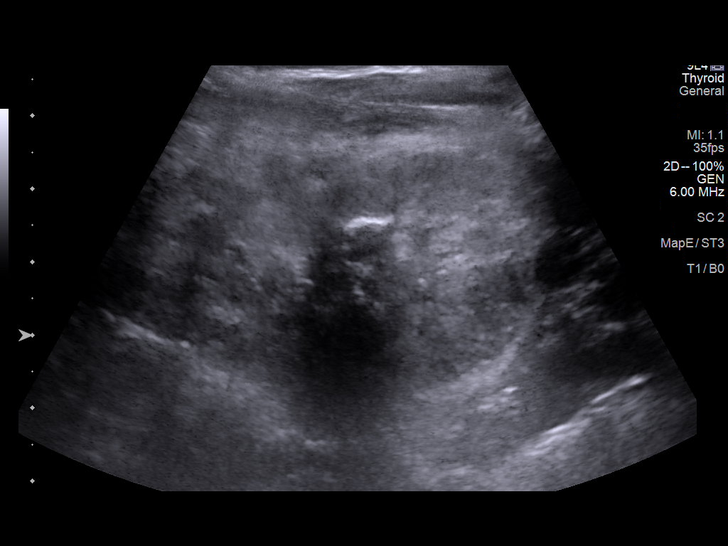
[im 50/58]
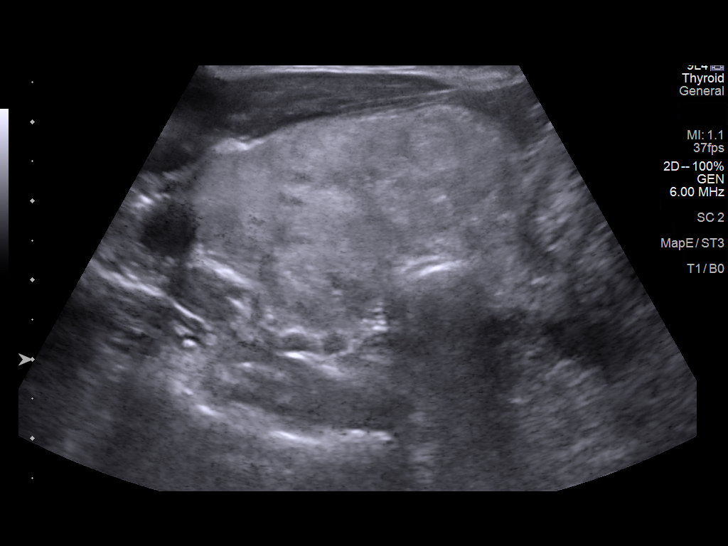
[im 55/58]
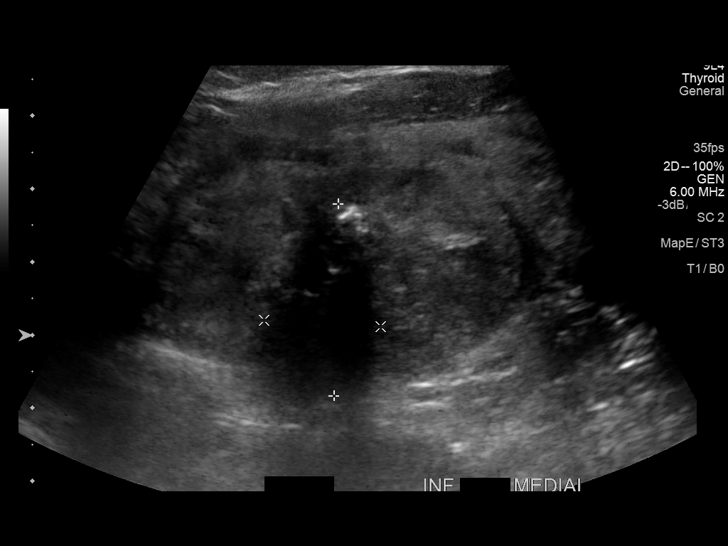

[12 of 25 positions shown; findings below may reference images not displayed]

FINDINGS: Parenchymal Echotexture: Markedly heterogenous

Isthmus: 1.2 cm

Right lobe: 9.1 x 3.0 x 3.7 cm

Left lobe: 9.9 x 3.7 x 5.7 cm

_________________________________________________________

Estimated total number of nodules >/= 1 cm: 1

Number of spongiform nodules >/=  2 cm not described below (TR1): 0

Number of mixed cystic and solid nodules >/= 1.5 cm not described
below (TR2): 0

_________________________________________________________

Nodule # 1:

Prior biopsy: Yes

Location: Left; Inferior

Maximum size: There is a region of decreased echogenicity with
associated shadowing calcifications that was measured by the
sonographer as 2.8 cm in greatest diameter. This is within an area
of potential larger nodularity that may measure as much as 4.8 cm.
Previously, a more discrete appearing nodule in the inferior left
lobe measured 3.5 cm and this was the nodule sample by [facility]. The
shadowing calcifications are new since prior imaging in 6476 and
4155.

Composition: solid/almost completely solid (2)

Echogenicity: hypoechoic (2)

Shape: not taller-than-wide (0)

Margins: ill-defined (0)

Echogenic foci: macrocalcifications (1)

ACR TI-RADS total points: 5.

ACR TI-RADS risk category:  TR4 (4-6 points).

Significant change in size (>/= 20% in two dimensions and minimal
increase of 2 mm): Difficult to determine.

Change in features: Yes

Change in ACR TI-RADS risk category: Yes

ACR TI-RADS recommendations:

Although it is difficult to determine whether the nodule has truly
increased in size, the overall dimensions likely are increased since
prior imaging and there has been interval development of new
shadowing internal calcifications. Although these may represent
benign dystrophic calcifications, this does increase the TI-RADS
risk categorization and repeat fine-needle aspiration of the nodule
should be considered.

_________________________________________________________

The rest of the thyroid gland is very heterogeneous and nodular in
appearance without a discrete measurable nodule identified. Overall
dimensions of the left lobe are larger compared to prior studies.
The right lobe appears grossly stable in morphology but slightly
larger in overall volume.
IMPRESSION: 1. The previously sampled left inferior thyroid nodule has changed
in morphology with development of new shadowing internal
calcifications centrally. The nodule may be larger but is difficult
to measure due to ill-defined margins. Due to change in risk
categorization, repeat fine-needle aspiration of this nodule should
be considered. If biopsy is not performed, recommend follow-up
ultrasound in 1 year.
2. The rest of the thyroid gland is heterogeneous without a discrete
measurable nodule identified by current ultrasound. The right lobe
is slightly larger in dimensions compared to prior studies. The left
lobe is also larger.

The above is in keeping with the ACR TI-RADS recommendations - [HOSPITAL] 5395;[DATE].

## 2024-02-05 NOTE — Progress Notes (Signed)
 Patient ID: Angelica Park, female   DOB: 12/30/1955, 68 y.o.   MRN: [id number] Erroneous encounter
